# Patient Record
Sex: Female | Born: 1940
Health system: Southern US, Community
[De-identification: ages and names within clinical notes are randomized; demographics above are authoritative.]

## PROBLEM LIST (undated history)

## (undated) DIAGNOSIS — Z95810 Presence of automatic (implantable) cardiac defibrillator: Secondary | ICD-10-CM

## (undated) DIAGNOSIS — I509 Heart failure, unspecified: Secondary | ICD-10-CM

## (undated) DIAGNOSIS — F32A Depression, unspecified: Secondary | ICD-10-CM

## (undated) DIAGNOSIS — E079 Disorder of thyroid, unspecified: Secondary | ICD-10-CM

## (undated) DIAGNOSIS — F329 Major depressive disorder, single episode, unspecified: Secondary | ICD-10-CM

## (undated) DIAGNOSIS — M1990 Unspecified osteoarthritis, unspecified site: Secondary | ICD-10-CM

## (undated) DIAGNOSIS — M797 Fibromyalgia: Secondary | ICD-10-CM

## (undated) DIAGNOSIS — K829 Disease of gallbladder, unspecified: Secondary | ICD-10-CM

## (undated) HISTORY — PX: COLONOSCOPY: SHX174

## (undated) HISTORY — DX: Depression, unspecified: F32.A

## (undated) HISTORY — DX: Disorder of thyroid, unspecified: E07.9

## (undated) HISTORY — DX: Fibromyalgia: M79.7

## (undated) HISTORY — PX: PACEMAKER INSERTION: SHX728

## (undated) HISTORY — DX: Heart failure, unspecified: I50.9

## (undated) HISTORY — DX: Disease of gallbladder, unspecified: K82.9

## (undated) HISTORY — PX: POLYPECTOMY: SHX149

## (undated) HISTORY — DX: Presence of automatic (implantable) cardiac defibrillator: Z95.810

## (undated) HISTORY — DX: Major depressive disorder, single episode, unspecified: F32.9

## (undated) HISTORY — DX: Unspecified osteoarthritis, unspecified site: M19.90

---

## 1999-12-02 ENCOUNTER — Other Ambulatory Visit: Admission: RE | Admit: 1999-12-02 | Discharge: 1999-12-02 | Payer: Self-pay | Admitting: *Deleted

## 2000-12-12 ENCOUNTER — Other Ambulatory Visit: Admission: RE | Admit: 2000-12-12 | Discharge: 2000-12-12 | Payer: Self-pay | Admitting: Internal Medicine

## 2003-10-25 DIAGNOSIS — Z95810 Presence of automatic (implantable) cardiac defibrillator: Secondary | ICD-10-CM

## 2003-10-25 HISTORY — PX: CARDIAC CATHETERIZATION: SHX172

## 2003-10-25 HISTORY — DX: Presence of automatic (implantable) cardiac defibrillator: Z95.810

## 2003-12-16 ENCOUNTER — Inpatient Hospital Stay (HOSPITAL_COMMUNITY): Admission: EM | Admit: 2003-12-16 | Discharge: 2003-12-24 | Payer: Self-pay | Admitting: Emergency Medicine

## 2003-12-23 HISTORY — PX: CARDIAC DEFIBRILLATOR PLACEMENT: SHX171

## 2004-12-08 ENCOUNTER — Ambulatory Visit: Payer: Self-pay | Admitting: Internal Medicine

## 2005-01-05 ENCOUNTER — Other Ambulatory Visit: Admission: RE | Admit: 2005-01-05 | Discharge: 2005-01-05 | Payer: Self-pay | Admitting: Internal Medicine

## 2005-05-19 ENCOUNTER — Ambulatory Visit: Payer: Self-pay

## 2005-08-16 ENCOUNTER — Ambulatory Visit: Payer: Self-pay | Admitting: Internal Medicine

## 2005-12-20 ENCOUNTER — Ambulatory Visit: Payer: Self-pay | Admitting: Internal Medicine

## 2006-03-21 ENCOUNTER — Ambulatory Visit: Payer: Self-pay | Admitting: Internal Medicine

## 2006-06-20 ENCOUNTER — Ambulatory Visit: Payer: Self-pay | Admitting: Internal Medicine

## 2006-10-23 ENCOUNTER — Ambulatory Visit: Payer: Self-pay

## 2006-12-19 ENCOUNTER — Ambulatory Visit: Payer: Self-pay | Admitting: Internal Medicine

## 2007-03-21 ENCOUNTER — Ambulatory Visit: Payer: Self-pay | Admitting: Internal Medicine

## 2007-06-19 ENCOUNTER — Ambulatory Visit: Payer: Self-pay | Admitting: Internal Medicine

## 2007-06-22 ENCOUNTER — Other Ambulatory Visit: Admission: RE | Admit: 2007-06-22 | Discharge: 2007-06-22 | Payer: Self-pay | Admitting: Internal Medicine

## 2007-08-21 ENCOUNTER — Encounter: Admission: RE | Admit: 2007-08-21 | Discharge: 2007-08-21 | Payer: Self-pay | Admitting: Internal Medicine

## 2007-09-25 ENCOUNTER — Ambulatory Visit: Payer: Self-pay | Admitting: Internal Medicine

## 2007-12-25 ENCOUNTER — Ambulatory Visit: Payer: Self-pay | Admitting: Internal Medicine

## 2008-02-28 ENCOUNTER — Encounter: Admission: RE | Admit: 2008-02-28 | Discharge: 2008-02-28 | Payer: Self-pay | Admitting: Internal Medicine

## 2008-03-10 ENCOUNTER — Encounter: Admission: RE | Admit: 2008-03-10 | Discharge: 2008-03-10 | Payer: Self-pay | Admitting: Internal Medicine

## 2008-03-25 ENCOUNTER — Ambulatory Visit: Payer: Self-pay | Admitting: Internal Medicine

## 2008-06-24 ENCOUNTER — Ambulatory Visit: Payer: Self-pay | Admitting: Internal Medicine

## 2008-09-30 ENCOUNTER — Encounter: Admission: RE | Admit: 2008-09-30 | Discharge: 2008-09-30 | Payer: Self-pay | Admitting: Internal Medicine

## 2008-10-07 ENCOUNTER — Ambulatory Visit: Payer: Self-pay | Admitting: Internal Medicine

## 2008-10-24 HISTORY — PX: COLONOSCOPY: SHX174

## 2008-11-07 ENCOUNTER — Ambulatory Visit (HOSPITAL_COMMUNITY): Admission: RE | Admit: 2008-11-07 | Discharge: 2008-11-07 | Payer: Self-pay | Admitting: *Deleted

## 2008-11-07 ENCOUNTER — Encounter (INDEPENDENT_AMBULATORY_CARE_PROVIDER_SITE_OTHER): Payer: Self-pay | Admitting: *Deleted

## 2008-12-02 ENCOUNTER — Encounter (INDEPENDENT_AMBULATORY_CARE_PROVIDER_SITE_OTHER): Payer: Self-pay | Admitting: Diagnostic Radiology

## 2008-12-02 ENCOUNTER — Ambulatory Visit (HOSPITAL_COMMUNITY): Admission: RE | Admit: 2008-12-02 | Discharge: 2008-12-02 | Payer: Self-pay | Admitting: Endocrinology

## 2008-12-17 ENCOUNTER — Encounter: Payer: Self-pay | Admitting: Internal Medicine

## 2009-01-06 ENCOUNTER — Ambulatory Visit: Payer: Self-pay | Admitting: Internal Medicine

## 2009-04-07 ENCOUNTER — Ambulatory Visit: Payer: Self-pay | Admitting: Internal Medicine

## 2009-05-18 ENCOUNTER — Telehealth: Payer: Self-pay | Admitting: Internal Medicine

## 2009-05-19 ENCOUNTER — Ambulatory Visit: Payer: Self-pay

## 2009-07-17 ENCOUNTER — Encounter: Payer: Self-pay | Admitting: Internal Medicine

## 2009-07-22 ENCOUNTER — Ambulatory Visit: Payer: Self-pay | Admitting: Internal Medicine

## 2009-07-28 ENCOUNTER — Encounter: Admission: RE | Admit: 2009-07-28 | Discharge: 2009-07-28 | Payer: Self-pay | Admitting: Endocrinology

## 2009-08-13 ENCOUNTER — Encounter: Payer: Self-pay | Admitting: Internal Medicine

## 2009-11-02 DIAGNOSIS — IMO0001 Reserved for inherently not codable concepts without codable children: Secondary | ICD-10-CM | POA: Insufficient documentation

## 2009-12-15 ENCOUNTER — Ambulatory Visit: Payer: Self-pay | Admitting: Internal Medicine

## 2009-12-15 DIAGNOSIS — I4901 Ventricular fibrillation: Secondary | ICD-10-CM

## 2009-12-15 DIAGNOSIS — Z95810 Presence of automatic (implantable) cardiac defibrillator: Secondary | ICD-10-CM

## 2010-03-16 ENCOUNTER — Ambulatory Visit: Payer: Self-pay | Admitting: Internal Medicine

## 2010-03-16 ENCOUNTER — Encounter: Payer: Self-pay | Admitting: Internal Medicine

## 2010-03-25 ENCOUNTER — Encounter: Payer: Self-pay | Admitting: Internal Medicine

## 2010-06-17 ENCOUNTER — Ambulatory Visit: Payer: Self-pay | Admitting: Internal Medicine

## 2010-07-07 ENCOUNTER — Encounter: Payer: Self-pay | Admitting: Internal Medicine

## 2010-07-26 ENCOUNTER — Encounter: Admission: RE | Admit: 2010-07-26 | Discharge: 2010-07-26 | Payer: Self-pay | Admitting: Endocrinology

## 2010-09-23 ENCOUNTER — Encounter: Payer: Self-pay | Admitting: Internal Medicine

## 2010-09-23 ENCOUNTER — Ambulatory Visit: Payer: Self-pay | Admitting: Internal Medicine

## 2010-10-01 ENCOUNTER — Other Ambulatory Visit
Admission: RE | Admit: 2010-10-01 | Discharge: 2010-10-01 | Payer: Self-pay | Source: Home / Self Care | Admitting: Internal Medicine

## 2010-10-07 ENCOUNTER — Encounter (INDEPENDENT_AMBULATORY_CARE_PROVIDER_SITE_OTHER): Payer: Self-pay | Admitting: *Deleted

## 2010-10-08 ENCOUNTER — Encounter
Admission: RE | Admit: 2010-10-08 | Discharge: 2010-10-08 | Payer: Self-pay | Source: Home / Self Care | Attending: Internal Medicine | Admitting: Internal Medicine

## 2010-11-13 ENCOUNTER — Other Ambulatory Visit: Payer: Self-pay | Admitting: Endocrinology

## 2010-11-13 DIAGNOSIS — E041 Nontoxic single thyroid nodule: Secondary | ICD-10-CM

## 2010-11-14 ENCOUNTER — Encounter: Payer: Self-pay | Admitting: Internal Medicine

## 2010-11-23 NOTE — Cardiovascular Report (Signed)
Summary: Office Visit Remote   Office Visit Remote   Imported By: Roderic Ovens 07/13/2010 11:53:19  _____________________________________________________________________  External Attachment:    Type:   Image     Comment:   External Document

## 2010-11-23 NOTE — Cardiovascular Report (Signed)
Summary: Office Visit Remote   Office Visit Remote   Imported By: Roderic Ovens 03/26/2010 10:53:39  _____________________________________________________________________  External Attachment:    Type:   Image     Comment:   External Document

## 2010-11-23 NOTE — Letter (Signed)
Summary: Remote Device Check  [employer], Main Office  [address]   [place], [place] [address]   Phone: [phone]  Fax: [phone]     July 07, 2010 MRN: [id number]   [facility] [address] [place], [place]  [address]   Dear [name],   Your remote transmission was recieved and reviewed by your physician.  All diagnostics were within normal limits for you.  __X___Your next transmission is scheduled for:  09-23-10.  Please transmit at any time this day.  If you have a wireless device your transmission will be sent automatically.   Sincerely,  Vella Kohler

## 2010-11-23 NOTE — Letter (Signed)
Summary: Remote Device Check  [employer], Main Office  [address]   [place], [place] [address]   Phone: [phone]  Fax: [phone]     March 25, 2010 MRN: [id number]   [facility] [address] [place], [place]  [address]   Dear [name],   Your remote transmission was recieved and reviewed by your physician.  All diagnostics were within normal limits for you.  __X___Your next transmission is scheduled for:   06-17-2010.  Please transmit at any time this day.  If you have a wireless device your transmission will be sent automatically.   Sincerely,  Vella Kohler

## 2010-11-23 NOTE — Assessment & Plan Note (Signed)
Summary: PC2   Visit Type:  Follow-up Primary Provider:  Renne Crigler, MD   History of Present Illness: [name] returns today for followup.  She is a pleasant 70 yo woman with an unexplained VF arrest.  She denies any intercurrent ICD therapies.  She has minimal peripheral edema which has been thought in the past to be due to NSAID use.  She has severe arthritis.  She denies c/p or sob.  Current Medications (verified): 1)  Sertraline Hcl 50 Mg Tabs (Sertraline Hcl) .... Take One Tablet By Mouth Once Daily. 2)  Meloxicam 15 Mg Tabs (Meloxicam) .... As Needed 3)  Synthroid 75 Mcg Tabs (Levothyroxine Sodium) .... Take One Tablet By Mouth Once Daily.  Allergies (verified): 1)  ! Sulfa  Past History:  Past Surgical History: Last updated: 11/02/2009 Defib implantation  Past Medical History: Current Problems:  IMPLANTATION OF DEFIBRILLATOR, HX OF (ICD-V45.02) FIBROMYALGIA (ICD-729.1)    Review of Systems  The patient denies chest pain, syncope, dyspnea on exertion, and peripheral edema.    Vital Signs:  Patient profile:   70 year old female Height:      63 inches Weight:      147 pounds BMI:     26.13 Pulse rate:   74 / minute BP sitting:   122 / 62  (left arm)  Vitals Entered By: Laurance Flatten CMA (December 15, 2009 2:06 PM)  Physical Exam  General:  Well developed, well nourished, in no acute distress.  HEENT: normal Neck: supple. No JVD. Carotids 2+ bilaterally no bruits Cor: RRR no rubs, gallops or murmur Lungs: CTA. Well healed ICD incision. Ab: soft, nontender. nondistended. No HSM. Good bowel sounds Ext: warm. no cyanosis, clubbing or edema Neuro: alert and oriented. Grossly nonfocal. affect pleasant     ICD Specifications Following MD:  Lewayne Bunting, MD     ICD Vendor:  Medtronic     ICD Model Number:  7232     ICD Serial Number:  [id number] H ICD DOI:  12/23/2003     ICD Implanting MD:  Lewayne Bunting, MD  Lead 1:    Location: RV     DOI: 12/23/2003     Model  #: 1914     Serial #: [id number]     Status: active  Indications::  VF ARREST   ICD Follow Up Remote Check?  No Battery Voltage:  3.02 V     Charge Time:  7.92 seconds     ICD Dependent:  No       ICD Device Measurements Right Ventricle:  Amplitude: 15.5 mV, Impedance: 480 ohms, Threshold: 1.0 V at 0.3 msec Shock Impedance: 53 ohms   Episodes Shock:  0     ATP:  0     Nonsustained:  166     Ventricular Pacing:  <0.1%  Brady Parameters Mode VVI     Lower Rate Limit:  40      Tachy Zones VF:  188     VT1:  150 (MONITOR)     Next Remote Date:  03/16/2010     Next Cardiology Appt Due:  11/24/2010 Tech Comments:  No parameter changes.  6949 lead stable, SIC  0.  Updated letter and magnet given to the patient.  Checked by [employer].  [facility]link transmissions every 3 months ROV 1 year Dr. Ladona Ridgel. Cynthia Harm, [name]  December 15, 2009 2:20 PM  MD Comments:  Agree with above.  Impression & Recommendations:  Problem # 1:  IMPLANTATION OF DEFIBRILLATOR, HX  OF (ICD-V45.02) Her device is working normally.  Will recheck in several months.   Problem # 2:  FIBROMYALGIA (ICD-729.1) Her pain is fairly well controlled on Meloxicam.  Problem # 3:  VENTRICULAR FIBRILLATION (ICD-427.41) No recurrent episodes.  Patient Instructions: 1)  Your physician recommends that you schedule a follow-up appointment in: 12 months with Dr Ladona Ridgel

## 2010-11-25 NOTE — Cardiovascular Report (Signed)
Summary: Office Visit Remote   Office Visit Remote   Imported By: Roderic Ovens 10/08/2010 09:32:05  _____________________________________________________________________  External Attachment:    Type:   Image     Comment:   External Document

## 2010-11-25 NOTE — Letter (Signed)
Summary: Remote Device Check  [employer], Main Office  [address]   [place], [place] [address]   Phone: [phone]  Fax: [phone]     October 07, 2010 MRN: [id number]   [facility] [address] [place], [place]  [address]   Dear Ms. Killian,   Your remote transmission was recieved and reviewed by your physician.  All diagnostics were within normal limits for you.  _____Your next transmission is scheduled for:                       .  Please transmit at any time this day.  If you have a wireless device your transmission will be sent automatically.  __X____Your next office visit is scheduled for:   February 2012 with Dr. Ladona Ridgel.                               Sincerely,  Altha Harm, LPN

## 2011-01-04 ENCOUNTER — Encounter (INDEPENDENT_AMBULATORY_CARE_PROVIDER_SITE_OTHER): Payer: Medicare Other | Admitting: Internal Medicine

## 2011-01-04 ENCOUNTER — Encounter: Payer: Self-pay | Admitting: Internal Medicine

## 2011-01-04 DIAGNOSIS — I469 Cardiac arrest, cause unspecified: Secondary | ICD-10-CM

## 2011-01-11 NOTE — Cardiovascular Report (Signed)
Summary: Office Visit   Office Visit   Imported By: Roderic Ovens 01/05/2011 11:33:52  _____________________________________________________________________  External Attachment:    Type:   Image     Comment:   External Document

## 2011-01-11 NOTE — Assessment & Plan Note (Signed)
Summary: device/saf /cy   Primary Provider:  Renne Crigler, MD   History of Present Illness: [name] returns today for followup.  She is a pleasant 70 yo woman with an unexplained VF arrest.  She denies any intercurrent ICD therapies.   Her arthritis is improved.  She denies c/p or sob. No peripheral edema.  Current Medications (verified): 1)  Sertraline Hcl 50 Mg Tabs (Sertraline Hcl) .... Take One Tablet By Mouth Once Daily. 2)  Meloxicam 15 Mg Tabs (Meloxicam) .... As Needed 3)  Synthroid 88 Mcg Tabs (Levothyroxine Sodium) .... Once Daily 4)  Multivitamins   Tabs (Multiple Vitamin) .... Once Daily 5)  Fosamax 70 Mg Tabs (Alendronate Sodium) .... Weekly 6)  Calcium .... Daily 7)  Vitamin D3 .... Daily 8)  Fish Oil   Oil (Fish Oil) .... Daily  Allergies: 1)  ! Sulfa  Past History:  Past Medical History: Last updated: 12/15/2009 Current Problems:  IMPLANTATION OF DEFIBRILLATOR, HX OF (ICD-V45.02) FIBROMYALGIA (ICD-729.1)    Past Surgical History: Last updated: 11/02/2009 Defib implantation  Review of Systems  The patient denies chest pain, syncope, dyspnea on exertion, and peripheral edema.    Vital Signs:  Patient profile:   70 year old female Height:      63 inches Weight:      147 pounds BMI:     26.13 Pulse rate:   74 / minute BP sitting:   110 / 67  (left arm)  Vitals Entered By: [name] CMA (January 04, 2011 12:13 PM)  Nutrition Counseling: Patient's BMI is greater than 25 and therefore counseled on weight management options.  Physical Exam  General:  Well developed, well nourished, in no acute distress.  HEENT: normal Neck: supple. No JVD. Carotids 2+ bilaterally no bruits Cor: RRR no rubs, gallops or murmur Lungs: CTA. Well healed ICD incision. Ab: soft, nontender. nondistended. No HSM. Good bowel sounds Ext: warm. no cyanosis, clubbing or edema Neuro: alert and oriented. Grossly nonfocal. affect pleasant     ICD Specifications Following MD:   Lewayne Bunting, MD     ICD Vendor:  Medtronic     ICD Model Number:  7232     ICD Serial Number:  [id number] H ICD DOI:  12/23/2003     ICD Implanting MD:  Lewayne Bunting, MD  Lead 1:    Location: RV     DOI: 12/23/2003     Model #: 4098     Serial #: [id number]     Status: active  Indications::  VF ARREST   ICD Follow Up ICD Dependent:  No      Brady Parameters Mode VVI     Lower Rate Limit:  40      Tachy Zones VF:  188     VT1:  150 (MONITOR)     MD Comments:  Normal device function.  Impression & Recommendations:  Problem # 1:  IMPLANTATION OF DEFIBRILLATOR, HX OF (ICD-V45.02) Her device is working normally. Will recheck in several months.  Problem # 2:  FIBROMYALGIA (ICD-729.1) Her symptoms are improved. Will follow.  Problem # 3:  VENTRICULAR FIBRILLATION (ICD-427.41) No recurrent syncope and no additional ICD shocks.  Patient Instructions: 1)  Your physician wants you to follow-up in: 12 months with Dr Court Joy will receive a reminder letter in the mail two months in advance. If you don't receive a letter, please call our office to schedule the follow-up appointment. 2)  Your physician recommends that you continue on your current  medications as directed. Please refer to the Current Medication list given to you today.

## 2011-01-24 ENCOUNTER — Inpatient Hospital Stay: Admission: RE | Admit: 2011-01-24 | Payer: Self-pay | Source: Ambulatory Visit

## 2011-01-26 ENCOUNTER — Other Ambulatory Visit: Payer: Self-pay | Admitting: Endocrinology

## 2011-01-26 DIAGNOSIS — E041 Nontoxic single thyroid nodule: Secondary | ICD-10-CM

## 2011-03-08 NOTE — Assessment & Plan Note (Signed)
[facility]                         ELECTROPHYSIOLOGY OFFICE NOTE   NAME:Meisinger, ARRIN ISHLER                      MRN:          [id number]  DATE:09/25/2007                            DOB:          1941/08/08    Ms. Odenthal returns today for follow up.  She is a very pleasant middle-  aged women with a history of resuscitated VF arrest and minimal anoxic  encephalopathy who is status post ICD insertion.  She returns today for  follow up.  In the interim, she has had no intercurrent ICD therapies.  She denies chest pain, she denies shortness of breath.   PHYSICAL EXAMINATION:  GENERAL:  On exam today, she is a pleasant, well-  appearing woman in no acute distress.  VITAL SIGNS:  Blood pressure was 120/68, pulse 64 and regular,  respirations 18.  Weight was 142 pounds.  NECK:  No jugular venous distention.  LUNGS:  Clear bilaterally to auscultation.  No wheezing, rales, or  rhonchi were present.  CARDIOVASCULAR:  Regular rate and rhythm.  Normal S1 and S2.  EXTREMITIES:  No edema.   Interrogation of her defibrillator demonstrates a Medtronic Maximo with  R waves of 15, impendence 448 and thresholds of 0.2.  Battery voltage  was 3.10 volts.  There were no intercurrent ICD therapies.   IMPRESSION:  1. Status post ventricular fibrillation (VF) arrest.  2. History of kidney stones.  3. History of gallbladder problems status post removal.   DISCUSSION:  Overall, Ms. Lares is stable.  Her defibrillator is  working normally.  There have been no intercurrent ICD therapies.  Will  plan to see her back in the office in 1 year.  She will follow up in our  care link program.     Doylene Canning. Ladona Ridgel, MD  Electronically Signed    GWT/MedQ  DD: 09/25/2007  DT: 09/25/2007  Job #: [id number]   cc:   Georgiana Spinner, M.D.  Soyla Murphy. Renne Crigler, M.D.

## 2011-03-08 NOTE — Op Note (Signed)
NAMEJUDI, [name]               ACCOUNT NO.:  [id number]   MEDICAL RECORD NO.:  [id number]          PATIENT TYPE:  AMB   LOCATION:  ENDO                         FACILITY:  [facility]   PHYSICIAN:  Georgiana Spinner, M.D.    DATE OF BIRTH:  [birth year]   DATE OF PROCEDURE:  11/07/2008  DATE OF DISCHARGE:                               OPERATIVE REPORT   PROCEDURE:  Upper endoscopy.   INDICATIONS:  GERD.   ANESTHESIA:  Fentanyl 60 mcg, Versed 5 mg.   PROCEDURE:  With the patient mildly sedated in the left lateral  decubitus position, the Pentax videoscopic endoscope was inserted in the  mouth and passed under direct vision through the esophagus which  appeared normal into the stomach.  Fundus, body, antrum, duodenal bulb,  and second portion duodenum all appeared normal.  From this point, the  endoscope was slowly withdrawn, taking circumferential views of duodenal  mucosa until the endoscope had been pulled back into stomach and placed  in retroflexion to view the stomach from below.  The endoscope was  straightened and withdrawn, taking circumferential views of the  remaining gastric and esophageal mucosa.  The patient's vital signs and  pulse oximeter remained stable.  The patient tolerated the procedure  well without apparent complication.   FINDINGS:  Unremarkable examination.   PLAN:  Proceed to colonoscopy.           ______________________________  Georgiana Spinner, M.D.     GMO/MEDQ  D:  11/07/2008  T:  11/07/2008  Job:  [id number]

## 2011-03-08 NOTE — Op Note (Signed)
Harrington, Cynthia               ACCOUNT NO.:  [id number]   MEDICAL RECORD NO.:  [id number]          PATIENT TYPE:  AMB   LOCATION:  ENDO                         FACILITY:  [facility]   PHYSICIAN:  Georgiana Spinner, M.D.    DATE OF BIRTH:  06/20/1941   DATE OF PROCEDURE:  11/07/2008  DATE OF DISCHARGE:                               OPERATIVE REPORT   PROCEDURE:  Colonoscopy.   INDICATIONS:  Colon cancer screening.   ANESTHESIA:  Fentanyl 40 mcg, Versed 5 mg.   PROCEDURE:  With the patient mildly sedated in the left lateral  decubitus position, the Pentax videoscopic colonoscope was inserted in  the rectum and passed under direct vision with pressure applied and the  patient rolled to her back.  We were able to advance through a tortuous  sigmoid colon to reach the cecum identified by base of cecum and  ileocecal valve.  From this point, the colonoscope was slowly withdrawn,  taking circumferential views of colonic mucosa, stopping in the  ascending colon where a flat polyp was seen.  It was red and sessile.  It was photographed, and it was removed after placing a magnet over her  ICD in her chest.  We were able to remove this using hot biopsy forceps  technique, setting of 20/150 blended current.  Remaining adenomatous-  type tissue was eradicated using the same hot biopsy forceps.  From this  point, the colonoscope was then further withdrawn, taking  circumferential views of the remaining colonic mucosa, stopping in the  sigmoid colon where it was thickened due to diverticulosis, and a second  polyp was seen that was much larger, probably a centimeter in size, and  it was removed using snare cautery technique that was pedunculated on a  thick wide polyp base.  Tissue was retrieved by suctioning it into the  endoscope and removing it.  Subsequently, we then reinserted the  colonoscope to this level and withdrew the colonoscope, stopping again  just distal to this area where a second  polyp in this area was seen,  photographed and removed again using snare cautery technique with the  same setting.  Tissue was retrieved by suctioning the polyp through the  scope into a tissue trap.  The endoscope was then withdrawn to the  rectum which appeared normal on direct and showed hemorrhoids on  retroflexed view.  The endoscope was straightened and withdrawn.  The  patient's vital signs and pulse oximeter remained stable.  The patient  tolerated the procedure well without apparent complication.   FINDINGS:  Polyp of ascending colon.  Polyps of sigmoid colon.  Internal  hemorrhoids.  Otherwise unremarkable exam.   PLAN:  Await biopsy report.  The patient will call me for results and  follow up as an outpatient.  Of note, once the procedure was terminated,  the magnet was removed from the ICD.           ______________________________  Georgiana Spinner, M.D.     GMO/MEDQ  D:  11/07/2008  T:  11/07/2008  Job:  [id number]

## 2011-03-08 NOTE — Assessment & Plan Note (Signed)
[facility]                         ELECTROPHYSIOLOGY OFFICE NOTE   NAME:Casstevens, SOMAYA GRASSI                      MRN:          [id number]  DATE:10/07/2008                            DOB:          1941/04/22    Ms. [name] returns today for followup.  She is a very pleasant middle-  aged woman with a history of VF arrest status post successful  resuscitation.  She has a history of gallbladder and kidney disease, but  is otherwise been stable.  She denies chest pain.  She denies shortness  of breath.   CURRENT MEDICATIONS:  Multivitamin, vitamin E, Zoloft, and vitamin D.  She also takes vitamin E.   PHYSICAL EXAMINATION:  GENERAL:  She is a pleasant well-appearing woman  in no acute distress.  VITAL SIGNS:  Blood pressure was 118/57, the pulse 60 and regular,  respirations were 18, and the weight was 146 pounds.  NECK:  No jugular venous distention.  LUNGS:  Clear bilaterally to auscultation.  No wheezes, rales, or  rhonchi are present.  There is no short of breathing.  CARDIOVASCULAR:  Regular rate and rhythm.  Normal S1 and S2.  EXTREMITIES:  No edema.   Interrogation of her defibrillator demonstrated a Medtronic Maximo  implanted in March 2005.  The R waves with 13, the impedance 456 with  thresholds of volt at 0.3.  Battery voltage was 3 volts.  Underlying  rhythm was sinus at 60.  She had an 69 and 49 lead implanted with no  episodes.   IMPRESSION:  1. Status post ventricular fibrillation arrest with successful      resuscitation.  2. Status post implantable cardioverter-defibrillator insertion.   DISCUSSION:  Overall, Ms. Boothe is stable.  Her defibrillator is  working normally.  She has had no recurrent ventricular arrhythmias.  We  will see the patient back in followup, and her defibrillator in 1 year.     Doylene Canning. Ladona Ridgel, MD  Electronically Signed    GWT/MedQ  DD: 10/07/2008  DT: 10/08/2008  Job #: [id number]   cc:   Georgiana Spinner,  M.D.  Soyla Murphy. Renne Crigler, M.D.

## 2011-03-11 NOTE — Assessment & Plan Note (Signed)
[facility]                         ELECTROPHYSIOLOGY OFFICE NOTE   NAME:Harrington, Cynthia YOUSE                      MRN:          [id number]  DATE:10/23/2006                            DOB:          [birth year]    DEVICE CLINIC NOTE:  Ms. Miles is seen in device clinic today,  October 23, 2006, for routine followup of her Medtronic Maximo 7232  single-chamber defibrillator implanted in March 2005 following a VF  arrest.  She also has a 6949 lead that was reprogrammed at this time.  Upon interrogation, there were 183 episodes stored in the device.  She  does have a monitoring zone programmed at 150 beats per minute.  Battery  voltage measures 3.13 V, with a charge time of 7.46 seconds.  In the  right ventricle, intrinsic amplitude is 14.6 mV, with an impedance of  480 ohms and threshold of 1 V at 0.2 msec.  High-voltage lead impedance  is 48 ohms.  No programming changes other than recommended lead changes  were made.  She will do [facility] transmissions and 3, 6, and 9 months'  time, and will be seen by Dr. Ladona Ridgel in one year's time.      Cleatrice Burke, RN  Electronically Signed      Doylene Canning. Ladona Ridgel, MD  Electronically Signed   CF/MedQ  DD: 10/23/2006  DT: 10/23/2006  Job #: [id number]

## 2011-03-11 NOTE — Cardiovascular Report (Signed)
NAME:  [name], [name]                         ACCOUNT NO.:  [id number]   MEDICAL RECORD NO.:  [id number]                   PATIENT TYPE:  INP   LOCATION:  4743                                 FACILITY:  [facility]   PHYSICIAN:  Charlies Constable, M.D.                  DATE OF BIRTH:  [birth year]   DATE OF PROCEDURE:  12/22/2003  DATE OF DISCHARGE:                              CARDIAC CATHETERIZATION   PROCEDURE:  Cardiac catheterization.   CARDIOLOGIST:  Charlies Constable, M.D.   HISTORY OF PRESENT ILLNESS:  [name] is 70 years old and suffered an out  of hospital cardiac arrest.  When EMS arrived, she was found to be asystolic  and subsequently went into ventricular fibrillation and required multiple  shocks.  It was thought that she would not have neurological recovery but  she has made a fairly dramatic neurological recovery.  Her enzymes were  negative for a myocardial infarction and echocardiogram showed good wall  motion.  Her EKG showed diffuse T-wave changes with a QT corrected to 500  msec.  She is scheduled for evaluation and angiography today.   DESCRIPTION OF PROCEDURE:  The procedure was performed via the right femoral  artery using an arterial sheath and 6 French preformed coronary catheters.  A front wall arterial puncture was performed, and Omnipaque contrast was  used.  The patient tolerated the procedure and left the laboratory in  satisfactory condition.   RESULTS:  PRESSURES:  1. The aortic pressure is 157/76 with a mean of 109.  2. The left ventricular pressure is 157/17.   CORONARY ANGIOGRAPHY:  The left main coronary artery:  The left main coronary artery was free of  significant disease.   Left anterior descending:  The left anterior descending artery gave rise to  a septal perforator and two diagonal branches.  The LAD terminated before  the apex.  The taper was quite rapid, but I think this was a natural taper  rather than a pathologic occlusion.   Circumflex  artery:  The circumflex artery gave rise to an intermediate  branch and two posterior lateral branch.  This vessel was free of  significant disease.   Right coronary artery:  The right coronary artery is a large dominant vessel  that gave rise to a right ventricular branch, a posterior descending branch,  and a two posterior lateral branches.  The posterior descending branch  reached all the way to the apex.  There was significant obstruction in this  vessel.   LEFT VENTRICULOGRAPHY:  The left ventriculogram was performed in the RAO  projection and showed good wall motion with no areas of hypokinesis.  The  estimated ejection fraction of 60%.   The left ventriculogram performed in the LAO projection showed good wall  motion with no evidence of hypokinesis.   CONCLUSION:  Normal coronary angiography and left ventricular wall motion.   RECOMMENDATIONS:  The  patient has suffered an out of hospital cardiac arrest  with asystole and then ventricular fibrillation.  The electrocardiogram  shows diffuse T-wave changes and a prolonged QT interval.  I discussed this  situation with Dr. Andee Lineman, and we plan to get a spiral CT to rule out  pulmonary embolism and will plan to get an EP consult for consideration for  defibrillator.                                               Charlies Constable, M.D.    BB/MEDQ  D:  12/22/2003  T:  12/23/2003  Job:  [id number]   cc:   Casimiro Needle B. Sherene Sires, M.D. [facility]   Learta Codding, M.D.   Catherine A. Orlin Hilding, M.D.  [address]  [address]  [place]  [place] [address]  Fax: [phone]   Cardiopulmonary Lab

## 2011-03-11 NOTE — Discharge Summary (Signed)
NAME:  [name], [name]                         ACCOUNT NO.:  [id number]   MEDICAL RECORD NO.:  [id number]                   PATIENT TYPE:  INP   LOCATION:  4743                                 FACILITY:  [facility]   PHYSICIAN:  Oley Balm. Sung Amabile, M.D. [facility]          DATE OF BIRTH:  [birth year]   DATE OF ADMISSION:  12/16/2003  DATE OF DISCHARGE:  12/24/2003                                 DISCHARGE SUMMARY   DISCHARGE DIAGNOSES:  1. Independent respiratory failure secondary to sudden cardiac arrest of     atrial fibrillation.  2. Hypoxia induced encephalopathy.  3. Acute renal failure.   HISTORY OF PRESENT ILLNESS:  [name] is a 70 year old white female with  history of tobacco abuse but no prior history of coronary artery disease.  She was admitted December 16, 2003, status post cardiac arrest.  She was  urgently intubated in the emergency room after being bag mask valve  controlled in the field.  The etiology of her distress was unknown.  Downtime was disputed and could have been up to 15 minutes.  She  subsequently converted to a ventricular fibrillation and course of  resuscitation and was shocked 9 times.  She remained on a mechanical  ventilatory support.  Pulmonary critical care was asked to evaluate and  assume her care due to the fact that she was vent dependent.   LABORATORY DATA:  Sodium 139, potassium 3.3, chloride 106, CO2 28, glucose  104, BUN 3, creatinine 0.7, calcium 8.9, magnesium 1.9, heparin level 0.39.  WBC 7.9, hemoglobin 14.3, hematocrit 42.4, platelets are 313.  Arterial  blood gas on 100% FiO2 on pressure __________ total tidal volume 750, rate  of 12, 5 of PEEP.  The  pH 7.45, PCO2 34 with O2 502 with bicarbonate 24.  AST was 116, ALT 108, ALP 60, total bilirubin 0.7, CK reached a peak of 718,  CK-MB reached a peak of 8.6, troponin I reached a peak of 0.9 of 1.05, TSH  was 0.360.  Respiratory culture showed normal __________ flora.   RADIOGRAPHIC DATA:  CT  of the chest shows negative for pulmonary embolism,  left pleural effusion with basilar atelectasis.  Chest x-ray shows pulmonary  vascular prominence, possible small pleural effusion to the left.  A 12-lead  EKG demonstrates ST wave abnormality with inferior ischemia with  anterolateral ischemia.  Ventricular rate of 91 beats per minutes.  CR  interval of 154 milliseconds.  QS duration of 84 with a QT/QTC of 352, 432  with a PRRT axis of 69, 62, and 182.  A 2D echo demonstrated on 12/18/2003  overall left ventricular systolic function normal, EF was 55-65%.  Cardiac  catheterization on February 28; that LV was normal.  Decision was made to  require an EP study.  Note that there was procedure note on December 23, 2003,  where the procedure was a VVI ICD for status post ventricular defibrillation  arrest.  Amplitude was 19.8.  Impedance was 990.  Threshold was 0.6 V, 0.5  PW, DFTS less than or equal to 65, __________ V pacing and sensitivity.  This was performed by Dr. Sharrell Ku.   HOSPITAL COURSE:  #1 -  INDEPENDENT RESPIRATORY FAILURE.  The patient was  admitted to [facility] and was orally and tracheally intubated from  February 22, and has since been liberated from mechanical ventilatory  support by December 18, 2003.  She had no further pulmonary complications at  that time.   #2 - HYPOXIC INDUCED ENCEPHALOPATHY.  She had an evaluation by Dr. Marcelino Freestone as noted in the chart.  She will be evaluated on outpatient basis by  Dr. Arley Phenix for further treatment of her anoxia brain injury.   #3 - STATUS POST CARDIAC ARREST WITH IMPLANTATION OF VVI PACER WITH INTERNAL  CARDIAC DEFIBRILLATOR ON December 22, 2003 BY DR. Sharrell Ku.  This was  noted on his extensive notes.  Please see note for further information.   FOLLOWUP:  She will be followed up by the Cardiology Service after  discharge.   DISCHARGE MEDICATIONS:  1. Aspirin 81 mg daily.  2. Toprol XL 25 mg  once daily.   DIET:  Low-salt, heart healthy.   SPECIAL INSTRUCTIONS:  She is to call Hershal Coria, Psy.D. who works  with brain trauma patients at [phone].  She is also to follow up with  Cardiology, Dr. Sharrell Ku.  Follow up with Dr. Merri Brunette who is her  primary care physician.  There will be no need to see the pulmonary  specialists.   CONDITION ON DISCHARGE:  Improved.  She has been liberated from mechanical  ventilatory support.      Brett Canales Minor, A.C.N.P. [facility]                 Oley Balm. Sung Amabile, M.D. [facility]    SM/MEDQ  D:  12/24/2003  T:  12/24/2003  Job:  [id number]   cc:   Georgiana Spinner, M.D.  [address]  [place]  [place] [address]  Fax: [phone]   Doylene Canning. Ladona Ridgel, M.D.   Soyla Murphy. Renne Crigler, M.D.  [address]  [place]  [place] [address]  Fax: [phone]   Gustavus Messing. Orlin Hilding, M.D.  [address]  [address]  [place]  [place] [address]  Fax: [phone]

## 2011-03-11 NOTE — Consult Note (Signed)
NAME:  [name], [name]                         ACCOUNT NO.:  [id number]   MEDICAL RECORD NO.:  [id number]                   PATIENT TYPE:  INP   LOCATION:  2112                                 FACILITY:  [facility]   PHYSICIAN:  Santina Evans A. Orlin Hilding, M.D.          DATE OF BIRTH:  17-Feb-1941   DATE OF CONSULTATION:  12/16/2003  DATE OF DISCHARGE:                                   CONSULTATION   REFERRING PHYSICIAN:  Dr. Nyoka Cowden.   REASON FOR CONSULTATION:  Post-cardiac arrest.   HISTORY OF PRESENT ILLNESS:  [name] is a 70 year old woman with a  history of fibromyalgia and gallbladder disease, who was found unresponsive  by her husband earlier this morning with gurgling respirations.  Duration of  her distress is unknown.  Husband attempted CPR.  She was in asystole when  EMS arrived.  She converted to ventricular fibrillation and eventually to a  supraventricular tachycardia.  She was intubated.  During the course of her  resuscitation, she was shocked 9 times, received epinephrine, atropine,  lidocaine and has had decerebrate and/or decorticate posturing since  arrival.   REVIEW OF SYSTEMS:  Review of systems is unobtainable.   PAST MEDICAL HISTORY:  Past medical history is significant for at least  fibromyalgia and gallbladder disease   MEDICATIONS:  Medications are unknown.   ALLERGIES:  Allergies are unknown.   SOCIAL HISTORY:  She lives with her husband; further history is not known.   FAMILY HISTORY:  Unable to obtain.  From a nephew, she was not bed-bound,  was at least ambulatory.   OBJECTIVE:  VITAL SIGNS:  Temperature 96.8, blood pressure 137/67, heart  rate in the 113 range, respirations are in the 17-19 range, ventilator is  set at 12.  She is 100% saturated.  HEENT:  The head is normocephalic, atraumatic.  She has some diffuse  rigidity throughout.  NEUROLOGIC:  She is not responsive to voice or gentle physical stimulus.  She does posture to deep  sternal rub.  She has an alternating  decerebrate/decorticate-type of pattern of upper extremity posturing, for  the most part, however, has increased tone and a sort of cortical fist.  There is spontaneous movement on the right upper extremity, but it is also  abnormal posturing.  She has extensor posturing of the legs with forced  plantarflexed feet and increased tone diffusely.  Cranial nerves:  Pupils  are rather large, probably due to the atropine, but do react briskly fairly  equally.  There is some degree of hippus present.  Extraocular movements:  There is some spontaneous roving eye movement a few degrees past midline to  either side; they can be forced with oculocephalic maneuver further to  either side.  The cranial response is fairly reliably reproducible and brisk  on the left, both to the sternutatory and corneal; on the right, it is a  little more equivocal.  She has a  spontaneous swallow.  There is no obvious  facial asymmetry, although she is intubated and it is difficult to tell.  She is breathing over the set ventilator rate. On motor exam, as already  noted, she has increased extensor hypertonicity diffusely with alternate  decorticate and decerebrate posturing in the upper extremities with some  spontaneous posturing in the right upper extremity, whether stimulus is  present or not.  Reflexes are brisk diffusely with upgoing toes.  Coordination not possible to assess.  Sensory:  She does grimace and  withdraw to pain in all four extremities, although some if it is abnormal  posturing to pain.   LABORATORY AND ACCESSORY CLINICAL DATA:  CT scan is fairly unremarkable.  There is no blurring of the deep gray-white junction.  Sulci may be effaced  posteriorly compared to anteriorly but I really cannot tell if that is  pathologically it.   The most significant laboratory finding is elevated cardiac enzymes  suggesting possible MI.   IMPRESSION:  Anoxic/hypoxic brain  injury, the extent of which is difficult  to tell this early.  She has all the cardinal brain stem reflexes present,  but she also has a lot of abnormal motor posturing as well.   RECOMMENDATIONS:  Watch and wait.  The next few days will probably tell the  tale.  We may need to eventually do EEG and CT, depending on her clinical  course over the next few days.  Overall, the prognosis for a meaningful  recovery after such an event is poor, 25%, and about half of those will have  some degree of severe disability.  Will follow.                                               Catherine A. Orlin Hilding, M.D.    CAW/MEDQ  D:  12/16/2003  T:  12/16/2003  Job:  [id number]

## 2011-03-11 NOTE — Op Note (Signed)
NAME:  [name], [name]                         ACCOUNT NO.:  [id number]   MEDICAL RECORD NO.:  [id number]                   PATIENT TYPE:  INP   LOCATION:  4743                                 FACILITY:  [facility]   PHYSICIAN:  Doylene Canning. Ladona Ridgel, M.D.               DATE OF BIRTH:  [birth year]   DATE OF PROCEDURE:  12/23/2003  DATE OF DISCHARGE:                                 OPERATIVE REPORT   PROCEDURE PERFORMED:  Insertion of a single-chamber defibrillator.   INDICATIONS:  Unexplained VF arrest.   I: INTRODUCTION:  The patient is a 70 year old woman who was found to have a  spontaneous episode of sudden cardiac death was successfully resuscitated.  She underwent catheterization demonstrating no coronary disease with normal  LV function.  She initially had hypoxic encephalopathy, but this has  resolved.  She is now referred for ICD insertion.   II: PROCEDURE:  After informed consent was obtained, the patient was taken  to the Diagnostic EP Lab in the fasting state.  After the usual preparation  and draping, intravenous Fentanyl and Midazolam were given for sedation.  A  total of 30 cc of lidocaine was infiltrated into the left infraclavicular  region.  A 9 cm incision was carried over this region and electrocautery  utilized to dissect down to the fascial plane.  Then 10 cc of contrast  injected into the left upper extremity venous system and demonstrated a  patent left subclavian vein.  It was punctured and the Medtronic model 6949  58 cm active fixation defibrillation lead serial number [id number] was  advanced into the right ventricle.  Mapping was carried out in the right  ventricle at the final site.  The R-waves measured 19 mV and the pacing  impedance 990 ohms.  The patient threshold was found to be 0.6 V at 0.5 ms  once the lead was actively fixed.  Ten volt pacing did not stimulate the  diaphragm.  With the lead in satisfactory position, it was secured to the  subpectoralis  fascia with a figure-of-eight silk suture.  The sewing sleeve  was secured with silk suture.  The Medtronic Maximal VR model 7232 single-  chamber defibrillator, serial number [id number] was connected to the  defibrillation lead and placed in the subcutaneous pocket.  The generator  was secured with silk suture.  Kanamycin irrigation was utilized to irrigate  the incision.  Electrocautery was utilized to assure hemostasis.  At this  point the patient was deeply sedated with Fentanyl and Versed and  defibrillation threshold testing carried out.   After the patient was deeply sedated with Fentanyl and Versed, VF was  induced with a T-wave shock.  An initial 15 joule shock was delivered,  terminating ventricular fibrillation and restoring sinus rhythm.  Five  minutes was allowed to elapse and a second VFT test carried out.  At this  time VF was induced with T-wave  shock and a 6 joule shock was delivered,  terminating ventricular fibrillation and restoring sinus rhythm.   At this point no additional defibrillation threshold testing was carried  out.  The generator was secured with a silk suture.  Additional kanamycin  was utilized to irrigate the incision and the incision then closed with a  layer of 2-0 Vicryl followed by a layer of 3-0 Vicryl followed by a layer of  4-0 Vicryl.  Benzoin was painted on the skin.  Steri-Strips were applied and  a pressure dressing placed and the patient returned to her room in  satisfactory condition.   III: COMPLICATIONS:  There were no immediate procedure complications.   IV: RESULTS:  This demonstrates successful implantation of a Medtronic  single-chamber defibrillator in a patient with an unexplained ventricular  fibrillation arrest.                                               Doylene Canning. Ladona Ridgel, M.D.    GWT/MEDQ  D:  12/23/2003  T:  12/24/2003  Job:  [id number]   cc:   Learta Codding, M.D.   Charlaine Dalton. Sherene Sires, M.D. [facility]   Kathrine Cords, R.N. [facility]

## 2011-03-11 NOTE — Consult Note (Signed)
NAME:  [name], [name]                         ACCOUNT NO.:  [id number]   MEDICAL RECORD NO.:  [id number]                   PATIENT TYPE:  INP   LOCATION:  2112                                 FACILITY:  [facility]   PHYSICIAN:  Learta Codding, M.D.                 DATE OF BIRTH:  September 13, 1941   DATE OF CONSULTATION:  12/18/2003  DATE OF DISCHARGE:                                   CONSULTATION   REASON FOR CONSULTATION:  Evaluation of patient status post cardiac arrest  with improved neurologic recovery.   HISTORY OF PRESENT ILLNESS:  Ms. Tissue is a 70 year old female with a  history of tobacco use, but no prior history of coronary artery disease. She  was admitted on December 16, 2003 post cardiac arrest. The patient  reportedly was found unresponsive by her husband earlier that morning with  gurgling respirations. The duration of her distress was unknown. The husband  initially attempted CPR. At the time of arrival of EMS the patient appeared  to be in asystole. The down time was not clear, but could have been up to 15  minutes. She subsequently converted to ventricular fibrillation and in the  course of resuscitation was shocked nine times. Ultimately, she remained in  initial supraventricular rhythm and then sinus tachycardia. She was also  intubated in the field and received Epinephrine, Atropine, and lidocaine. On  arrival to [facility] the patient initially was unresponsive, but  did have brainstem reflexes with decerebrate end corticate posturing upon  arrival. The patient was seen by neurology. In the next ensuing days her  mental function improved. She is now extubated. She is alert and oriented  x1. She still has a retrograde amnesia and is quite confused, but does  respond to simple commands and is otherwise appropriate.   LABORATORY DATA:  Laboratory work on admission did show that the patient had  an elevated cardiac troponin with a peak troponin of 1.05. CK was  elevated  at 2194, CK-MB of 14. Initial electrocardiogram demonstrated sinus  tachycardia with nonspecific T-wave changes. Subsequent electrocardiogram  the next day did show sinus tachycardia now with deeply inverted symmetrical  T-wave changes. However, there was no evidence of ST elevation myocardial  infarction.   PAST MEDICAL HISTORY:  The patient's history was reviewed with her husband  and her sister. The patient reportedly was not on any psychotropic  medications or QT prolonging drugs as far as we know. She did report over  the last several weeks increased symptoms of what appears to be her chronic  fibromyalgia as well as acid reflux. She was in the process of a workup with  [facility] with a gallbladder ultrasound and was also  given antireflux measures. The husband tells me that the patient is rather  secretive about her medical care and he was not aware of this, but he did  find a  receipt for a gallbladder ultrasound. She apparently is rather active  and has no prior history of cardiac disease. She does smoke up to a pack a  day.   ALLERGIES:  No known drug allergies.   MEDICATIONS:  In the hospital the patient received:  1. Enoxaparin 40 mg q.24.  2. Metoprolol 5 mg IV q.6h.  3. Insulin p.r.n.  4. Haldol for confusion.  5. Protonix 40 mg a day.  6. Maxipime 1 g q.12h.  7. Midazolam and Lorazepam for sedation.   SOCIAL HISTORY:  As outlined above. The patient lives with her husband. She  does smoke. There is no history of use.   FAMILY HISTORY:  Reportedly no history of premature coronary artery disease.  Mother was 107 years old and grandmother died when she was 9.   REVIEW OF SYSTEMS:  As per HPI. The patient's history is still difficult.  She reports, however, no chest pain, shortness of breath, nausea or  vomiting, abdominal pain at the present time. There is no prior history of  syncope or presyncope and this history was obtained per the  husband.   PHYSICAL EXAMINATION:  VITAL SIGNS:  Blood pressure 130/75, heart rate 110  beats per minute, temperature afebrile.  GENERAL:  Well-nourished somewhat pale appearing white female in no  distress. Confused, but responding to verbal commands.  NECK:  Normal carotid upstroke. No carotid bruit. No JVD.  LUNGS:  Clear breath sounds with a few crackles at the bases, but no  wheezing.  HEART:  Regular rate and rhythm. Tachycardic. Soft murmur in the left upper  sternal border. PMI is nondisplaced. No S3 or S4.  ABDOMEN:  Soft, nontender. No rebound or guarding. Good bowel sounds.  EXTREMITIES:  Peripheral pulses are palpable. Femoral pulses are 2+  bilaterally with no bruits. Dorsalis pedis and posterior tibial pulses are  intact. There is trace peripheral pitting edema.   LABORATORY DATA:  A 12-lead electrocardiogram as outlined above.   Sodium 137, potassium 8.4, BUN 8, creatinine 0.7. CBC:  Hemoglobin 14.6,  white count 17.3, and platelet count is 286. Troponin as outlined above.   IMPRESSION/PLAN:  Status post cardiac arrest. The differential diagnosis  include ischemic heart disease and the patient ultimately will need a  cardiac catheterization. This will be further discussed below. Included in  the differential diagnosis pulmonary embolism or primary arrhythmia  secondary to ST-QT prolonging medications (none obtained in the history).  Other forms of structural heart disease are also possible including  arrhythmogenic biventricular dysplasia. The patient will require further  workup. Initially would proceed with an echocardiographic study as this will  narrow down the differential diagnosis noninvasive. Electrocardiographic  changes are suggestive of underlying ischemic heart disease, but her mildly  elevated troponin makes me suspicious of a possible pulmonary embolism. In  the interim I would start her on aspirin, IV heparin, and increase her IV beta blocker. I would  also consider a spiral CT scan prior to performing a  cardiac catheterization.   The patient definitely will need a cardiac catheterization in the next  couple of days. I would, however, wait for some improved neurologic recovery  and make sure that we are not dealing with some underlying etiology like  pulmonary embolism.   Orders have been written for the patient to go on aspirin, IV heparin, beta  blocker therapy, as well as 2-D echocardiographic study. We will evaluate  her in the next few days for a cardiac catheterization. I have  discussed the  assessment and plan at great length with the patient's husband and sister  and they are in agreement with our plan.                                               Learta Codding, M.D.    GED/MEDQ  D:  12/18/2003  T:  12/18/2003  Job:  [id number]   cc:   Charlaine Dalton. Sherene Sires, M.D. [facility]   Dr. Kevan Rosebush A. Orlin Hilding, M.D.  [address]  [address]  [place]  [place] [address]  Fax: [phone]   [facility]

## 2011-04-07 ENCOUNTER — Encounter: Payer: Medicare Other | Admitting: *Deleted

## 2011-04-11 ENCOUNTER — Encounter: Payer: Self-pay | Admitting: *Deleted

## 2011-04-14 ENCOUNTER — Ambulatory Visit (INDEPENDENT_AMBULATORY_CARE_PROVIDER_SITE_OTHER): Payer: Medicare Other | Admitting: *Deleted

## 2011-04-14 ENCOUNTER — Other Ambulatory Visit: Payer: Self-pay | Admitting: Internal Medicine

## 2011-04-14 DIAGNOSIS — I4901 Ventricular fibrillation: Secondary | ICD-10-CM

## 2011-04-14 DIAGNOSIS — Z95810 Presence of automatic (implantable) cardiac defibrillator: Secondary | ICD-10-CM

## 2011-04-26 ENCOUNTER — Encounter: Payer: Self-pay | Admitting: *Deleted

## 2011-04-26 NOTE — Progress Notes (Signed)
icd remote check  

## 2011-07-21 ENCOUNTER — Encounter: Payer: Self-pay | Admitting: Internal Medicine

## 2011-07-21 ENCOUNTER — Other Ambulatory Visit: Payer: Self-pay | Admitting: Internal Medicine

## 2011-07-21 ENCOUNTER — Ambulatory Visit (INDEPENDENT_AMBULATORY_CARE_PROVIDER_SITE_OTHER): Payer: Medicare Other | Admitting: *Deleted

## 2011-07-21 DIAGNOSIS — I4901 Ventricular fibrillation: Secondary | ICD-10-CM

## 2011-07-21 DIAGNOSIS — I469 Cardiac arrest, cause unspecified: Secondary | ICD-10-CM

## 2011-07-27 ENCOUNTER — Encounter: Payer: Self-pay | Admitting: *Deleted

## 2011-07-28 NOTE — Progress Notes (Signed)
icd remote  

## 2011-08-01 ENCOUNTER — Ambulatory Visit
Admission: RE | Admit: 2011-08-01 | Discharge: 2011-08-01 | Disposition: A | Discharge status: Home / Self Care | Payer: Medicare Other | Source: Ambulatory Visit | Attending: Endocrinology | Admitting: Endocrinology

## 2011-08-01 DIAGNOSIS — E041 Nontoxic single thyroid nodule: Secondary | ICD-10-CM

## 2011-10-11 ENCOUNTER — Other Ambulatory Visit (HOSPITAL_COMMUNITY)
Admission: RE | Admit: 2011-10-11 | Discharge: 2011-10-11 | Disposition: A | Discharge status: Home / Self Care | Payer: Medicare Other | Source: Ambulatory Visit | Attending: Internal Medicine | Admitting: Internal Medicine

## 2011-10-11 ENCOUNTER — Other Ambulatory Visit: Payer: Self-pay | Admitting: Registered Nurse

## 2011-10-11 DIAGNOSIS — Z01419 Encounter for gynecological examination (general) (routine) without abnormal findings: Secondary | ICD-10-CM | POA: Insufficient documentation

## 2011-10-20 ENCOUNTER — Ambulatory Visit (INDEPENDENT_AMBULATORY_CARE_PROVIDER_SITE_OTHER): Payer: Medicare Other | Admitting: *Deleted

## 2011-10-20 ENCOUNTER — Encounter: Payer: Self-pay | Admitting: Internal Medicine

## 2011-10-20 ENCOUNTER — Other Ambulatory Visit: Payer: Self-pay | Admitting: Internal Medicine

## 2011-10-20 DIAGNOSIS — Z95810 Presence of automatic (implantable) cardiac defibrillator: Secondary | ICD-10-CM

## 2011-10-20 DIAGNOSIS — I4901 Ventricular fibrillation: Secondary | ICD-10-CM

## 2011-10-21 LAB — REMOTE ICD DEVICE
BRDY-0002RV: 40 {beats}/min
DEV-0020ICD: NEGATIVE
RV LEAD AMPLITUDE: 12.6 mv
TZAT-0001FASTVT: 1
TZAT-0001FASTVT: 3
TZAT-0001FASTVT: 4
TZAT-0001SLOWVT: 1
TZAT-0001SLOWVT: 4
TZAT-0001SLOWVT: 5
TZAT-0002FASTVT: NEGATIVE
TZAT-0002FASTVT: NEGATIVE
TZAT-0002FASTVT: NEGATIVE
TZAT-0002SLOWVT: NEGATIVE
TZAT-0002SLOWVT: NEGATIVE
TZAT-0002SLOWVT: NEGATIVE
TZAT-0002SLOWVT: NEGATIVE
TZAT-0002SLOWVT: NEGATIVE
TZAT-0012FASTVT: 200 ms
TZAT-0012FASTVT: 200 ms
TZAT-0012FASTVT: 200 ms
TZAT-0012SLOWVT: 200 ms
TZAT-0012SLOWVT: 200 ms
TZAT-0012SLOWVT: 200 ms
TZAT-0012SLOWVT: 200 ms
TZAT-0012SLOWVT: 200 ms
TZAT-0018FASTVT: NEGATIVE
TZAT-0018FASTVT: NEGATIVE
TZAT-0018FASTVT: NEGATIVE
TZAT-0018SLOWVT: NEGATIVE
TZAT-0018SLOWVT: NEGATIVE
TZAT-0019FASTVT: 8 V
TZAT-0019FASTVT: 8 V
TZAT-0019FASTVT: 8 V
TZAT-0019FASTVT: 8 V
TZAT-0019SLOWVT: 8 V
TZAT-0019SLOWVT: 8 V
TZAT-0019SLOWVT: 8 V
TZAT-0020FASTVT: 1.6 ms
TZAT-0020FASTVT: 1.6 ms
TZAT-0020FASTVT: 1.6 ms
TZAT-0020SLOWVT: 1.6 ms
TZAT-0020SLOWVT: 1.6 ms
TZAT-0020SLOWVT: 1.6 ms
TZON-0003SLOWVT: 400 ms
TZON-0008SLOWVT: 0 ms
TZON-0010AFLUTTER: 30 ms
TZON-0010SLOWVT: 30 ms

## 2011-10-27 NOTE — Progress Notes (Signed)
Remote icd check  

## 2011-11-02 ENCOUNTER — Encounter: Payer: Self-pay | Admitting: Internal Medicine

## 2011-11-04 ENCOUNTER — Other Ambulatory Visit: Payer: Self-pay | Admitting: Internal Medicine

## 2011-11-04 DIAGNOSIS — Z1231 Encounter for screening mammogram for malignant neoplasm of breast: Secondary | ICD-10-CM

## 2011-11-09 ENCOUNTER — Encounter: Payer: Self-pay | Admitting: *Deleted

## 2011-11-21 ENCOUNTER — Ambulatory Visit
Admission: RE | Admit: 2011-11-21 | Discharge: 2011-11-21 | Disposition: A | Discharge status: Home / Self Care | Payer: Medicare Other | Source: Ambulatory Visit | Attending: Internal Medicine | Admitting: Internal Medicine

## 2011-11-21 DIAGNOSIS — Z1231 Encounter for screening mammogram for malignant neoplasm of breast: Secondary | ICD-10-CM

## 2011-11-22 ENCOUNTER — Telehealth: Payer: Self-pay | Admitting: *Deleted

## 2011-11-22 NOTE — Telephone Encounter (Signed)
[name], I have reviewed the prior colonoscopy report and pathology. The patient had a tubular adenoma and an adenoma with high-grade dysplasia. YES, she is due for surveillance colonoscopy at this time. Please make sure that her colonoscopy report and her pathology report get placed in her electronic medical record (EPIC) in the appropriate area. Thank you, Dr. Marina Goodell

## 2011-11-22 NOTE — Telephone Encounter (Signed)
Dr. Marina Goodell please review [name]'s colon report and path and advise if she needs recall colon at this time.  Her last colon was 11/07/2008 with Dr. Virginia Rochester.  Thank you,  [name]

## 2011-11-22 NOTE — Telephone Encounter (Signed)
Colon report,path and EGD report given to [facility] to scan into Epic.  [name]

## 2011-11-23 ENCOUNTER — Ambulatory Visit (AMBULATORY_SURGERY_CENTER): Payer: Medicare Other | Admitting: *Deleted

## 2011-11-23 ENCOUNTER — Encounter: Payer: Self-pay | Admitting: Internal Medicine

## 2011-11-23 VITALS — Ht 63.0 in | Wt 143.8 lb

## 2011-11-23 DIAGNOSIS — Z1211 Encounter for screening for malignant neoplasm of colon: Secondary | ICD-10-CM

## 2011-11-23 MED ORDER — MOVIPREP 100 G PO SOLR: ORAL | Status: DC

## 2011-12-06 ENCOUNTER — Encounter: Payer: Self-pay | Admitting: Internal Medicine

## 2011-12-06 ENCOUNTER — Ambulatory Visit (AMBULATORY_SURGERY_CENTER): Payer: Medicare Other | Admitting: Internal Medicine

## 2011-12-06 VITALS — BP 127/61 | HR 69 | Temp 99.4°F | Resp 18 | Ht 63.0 in | Wt 143.0 lb

## 2011-12-06 DIAGNOSIS — Z1211 Encounter for screening for malignant neoplasm of colon: Secondary | ICD-10-CM

## 2011-12-06 DIAGNOSIS — D126 Benign neoplasm of colon, unspecified: Secondary | ICD-10-CM

## 2011-12-06 DIAGNOSIS — Z86010 Personal history of colonic polyps: Secondary | ICD-10-CM

## 2011-12-06 MED ORDER — SODIUM CHLORIDE 0.9 % IV SOLN: 500.0000 mL | INTRAVENOUS | Status: DC

## 2011-12-06 NOTE — Op Note (Signed)
[facility] [address] [place], [place]  [address]  COLONOSCOPY PROCEDURE REPORT  PATIENT:  [name], [name]  MR#:  [id number] BIRTHDATE:  07-May-1941, 70 yrs. old  GENDER:  female ENDOSCOPIST:  Wilhemina Bonito. Eda Keys, MD REF. BY:  Soyla Murphy. Renne Crigler, M.D. PROCEDURE DATE:  12/06/2011 PROCEDURE:  Colonoscopy with snare polypectomy x 2 ASA CLASS:  Class III INDICATIONS:  history of pre-cancerous (adenomatous) colon polyps, surveillance and high-risk screening ; index exam 11-07-2008 (Dr. Virginia Rochester) w/ TA and HGD MEDICATIONS:   MAC sedation, administered by CRNA, propofol (Diprivan) 300 mg IV  DESCRIPTION OF PROCEDURE:   After the risks benefits and alternatives of the procedure were thoroughly explained, informed consent was obtained.  Digital rectal exam was performed and revealed no abnormalities.   The LB [id number]-[id number] endoscope was introduced through the anus and advanced to the cecum, which was identified by both the appendix and ileocecal valve, without limitations.  The quality of the prep was excellent, using MoviPrep.  The instrument was then slowly withdrawn as the colon was fully examined. <<PROCEDUREIMAGES>>  FINDINGS:  A diminutive polyp was found in the ascending colon and snared without cautery. Retrieval was successful.  A diminutive polyp was found in the rectum and snared without cautery. Retrieval was successful.  Moderate diverticulosis was found in the sigmoid colon.  Otherwise normal colonoscopy without other polyps, masses, vascular ectasias, or inflammatory changes. Retroflexed views in the rectum revealed internal hemorrhoids.  The time to cecum =  5:54  minutes. The scope was then withdrawn in 21:00  minutes from the cecum and the procedure completed.  COMPLICATIONS:  None  ENDOSCOPIC IMPRESSION: 1) Diminutive polyp in the ascending colon - removed 2) Diminutive polyp in the rectum - removed 3) Moderate diverticulosis in the sigmoid colon 4)  Otherwise normal colonoscopy  RECOMMENDATIONS: 1) Follow up colonoscopy in 5 years  ______________________________ Wilhemina Bonito. Eda Keys, MD  CC:  Romero Liner, MD; The Patient  n. eSIGNED:   Wilhemina Bonito. Eda Keys at 12/06/2011 09:48 AM  Durenda Hurt, [id number]

## 2011-12-06 NOTE — Progress Notes (Signed)
Patient did not have preoperative order for IV antibiotic SSI prophylaxis. (G8918)  Patient did not experience any of the following events: a burn prior to discharge; a fall within the facility; wrong site/side/patient/procedure/implant event; or a hospital transfer or hospital admission upon discharge from the facility. (G8907)  

## 2011-12-06 NOTE — Patient Instructions (Signed)
Please, read the handouts given to you by your recovery room nurse.  Your polyp results will be mailed to your home within two weeks.  You may resume your routine medications today.  Try to increase the fiber in your diet due to your diverticulosis.   If you have any questions, please call us at [phone].   Thank-you.

## 2011-12-07 ENCOUNTER — Telehealth: Payer: Self-pay | Admitting: *Deleted

## 2011-12-07 NOTE — Telephone Encounter (Signed)
  Follow up Call-  Call back number 12/06/2011  Post procedure Call Back phone  # [phone]  Permission to leave phone message Yes     Patient questions:  Do you have a fever, pain , or abdominal swelling? no Pain Score  0 *  Have you tolerated food without any problems? yes  Have you been able to return to your normal activities? yes  Do you have any questions about your discharge instructions: Diet   no Medications  no Follow up visit  no  Do you have questions or concerns about your Care? no  Actions: * If pain score is 4 or above: No action needed, pain <4.

## 2011-12-13 ENCOUNTER — Encounter: Payer: Self-pay | Admitting: Internal Medicine

## 2011-12-26 ENCOUNTER — Ambulatory Visit (INDEPENDENT_AMBULATORY_CARE_PROVIDER_SITE_OTHER): Payer: Medicare Other | Admitting: Internal Medicine

## 2011-12-26 ENCOUNTER — Encounter: Payer: Self-pay | Admitting: Internal Medicine

## 2011-12-26 DIAGNOSIS — I4901 Ventricular fibrillation: Secondary | ICD-10-CM

## 2011-12-26 DIAGNOSIS — Z95810 Presence of automatic (implantable) cardiac defibrillator: Secondary | ICD-10-CM

## 2011-12-26 LAB — ICD DEVICE OBSERVATION
DEV-0020ICD: NEGATIVE
FVT: 0
PACEART VT: 0
RV LEAD AMPLITUDE: 15.5 mv
RV LEAD THRESHOLD: 1 V
TZAT-0001FASTVT: 1
TZAT-0001FASTVT: 2
TZAT-0001FASTVT: 6
TZAT-0001SLOWVT: 2
TZAT-0001SLOWVT: 3
TZAT-0001SLOWVT: 4
TZAT-0002FASTVT: NEGATIVE
TZAT-0002FASTVT: NEGATIVE
TZAT-0002SLOWVT: NEGATIVE
TZAT-0002SLOWVT: NEGATIVE
TZAT-0012FASTVT: 200 ms
TZAT-0012FASTVT: 200 ms
TZAT-0012SLOWVT: 200 ms
TZAT-0012SLOWVT: 200 ms
TZAT-0018FASTVT: NEGATIVE
TZAT-0018FASTVT: NEGATIVE
TZAT-0018SLOWVT: NEGATIVE
TZAT-0018SLOWVT: NEGATIVE
TZAT-0018SLOWVT: NEGATIVE
TZAT-0018SLOWVT: NEGATIVE
TZAT-0019FASTVT: 8 V
TZAT-0019FASTVT: 8 V
TZAT-0019FASTVT: 8 V
TZAT-0019SLOWVT: 8 V
TZAT-0019SLOWVT: 8 V
TZAT-0019SLOWVT: 8 V
TZAT-0020FASTVT: 1.6 ms
TZAT-0020FASTVT: 1.6 ms
TZAT-0020FASTVT: 1.6 ms
TZAT-0020SLOWVT: 1.6 ms
TZAT-0020SLOWVT: 1.6 ms
TZAT-0020SLOWVT: 1.6 ms
TZAT-0020SLOWVT: 1.6 ms
TZON-0003FASTVT: 0 ms
TZON-0003SLOWVT: 400 ms
TZON-0004SLOWVT: 20
TZON-0005SLOWVT: 12
TZON-0008SLOWVT: 0 ms
TZON-0010FASTVT: 30 ms
TZON-0010VSLOWVT: 30 ms
TZON-0011AFLUTTER: 70
VENTRICULAR PACING ICD: 0 pct

## 2011-12-26 NOTE — Patient Instructions (Signed)
Remote monitoring is used to monitor your Pacemaker of ICD from home. This monitoring reduces the number of office visits required to check your device to one time per year. It allows [place] to keep an eye on the functioning of your device to ensure it is working properly. You are scheduled for a device check from home on 03/29/12. You may send your transmission at any time that day. If you have a wireless device, the transmission will be sent automatically. After your physician reviews your transmission, you will receive a postcard with your next transmission date.  Your physician wants you to follow-up in: 12 months with Dr. Ladona Ridgel.  You will receive a reminder letter in the mail two months in advance. If you don't receive a letter, please call our office to schedule the follow-up appointment.

## 2011-12-26 NOTE — Progress Notes (Signed)
HPI [name] returns today for followup. She is a very pleasant 71 year old woman with resuscitated ventricular fibrillation cardiac arrest. She is status post ICD implantation. She has hypertension and thyroid dysfunction. Allergies  Allergen Reactions  . Sulfonamide Derivatives Other (See Comments)    Blisters in mouth     Current Outpatient Prescriptions  Medication Sig Dispense Refill  . calcium carbonate 200 MG capsule Take 250 mg by mouth daily.      . cholecalciferol (VITAMIN D) 1000 UNITS tablet Take 1,000 Units by mouth daily.      . fish oil-omega-3 fatty acids 1000 MG capsule Take 1 g by mouth daily.      Marland Kitchen levothyroxine (SYNTHROID, LEVOTHROID) 100 MCG tablet Take 100 mcg by mouth daily.      . Multiple Vitamins-Minerals (MULTIVITAMIN WITH MINERALS) tablet Take 1 tablet by mouth daily.      . NON FORMULARY Take 70 mg by mouth once a week. Fossamax      . sertraline (ZOLOFT) 100 MG tablet Take 100 mg by mouth daily.      . vitamin E (VITAMIN E) 400 UNIT capsule Take 400 Units by mouth daily.         Past Medical History  Diagnosis Date  . Arthritis   . Arthritis   . Congestive heart failure   . Cardiac defibrillator in place 2005  . Depression   . Osteoporosis   . Thyroid disease   . Osteoporosis   . Thyroid disease   . Fibromyalgia   . Gallbladder disease     ROS:   All systems reviewed and negative except as noted in the HPI.   Past Surgical History  Procedure Date  . Colonoscopy   . Polypectomy   . Colonoscopy 2010    Adenomatous with high grade dysplasia  . Cardiac catheterization 2005  . Cardiac defibrillator placement      Family History  Problem Relation Age of Onset  . Breast cancer Sister      History   Social History  . Marital Status: Married    Spouse Name: N/A    Number of Children: N/A  . Years of Education: N/A   Occupational History  . Not on file.   Social History Main Topics  . Smoking status: Former Smoker   Quit date: 11/22/2006  . Smokeless tobacco: Never Used  . Alcohol Use: No  . Drug Use: No  . Sexually Active: Not on file   Other Topics Concern  . Not on file   Social History Narrative  . No narrative on file     BP 126/62  Pulse 72  Ht 5' 3.5" (1.613 m)  Wt 64.919 kg (143 lb 1.9 oz)  BMI 24.95 kg/m2  Physical Exam:  Well appearing NAD HEENT: Unremarkable Neck:  No JVD, no thyromegally Lymphatics:  No adenopathy Back:  No CVA tenderness Lungs:  Clear HEART:  Regular rate rhythm, no murmurs, no rubs, no clicks Abd:  soft, positive bowel sounds, no organomegally, no rebound, no guarding Ext:  2 plus pulses, no edema, no cyanosis, no clubbing Skin:  No rashes no nodules Neuro:  CN II through XII intact, motor grossly intact  EKG  DEVICE  Normal device function.  See PaceArt for details.   Assess/Plan:

## 2012-02-03 ENCOUNTER — Other Ambulatory Visit: Payer: Self-pay | Admitting: Endocrinology

## 2012-02-03 DIAGNOSIS — E041 Nontoxic single thyroid nodule: Secondary | ICD-10-CM

## 2012-03-29 ENCOUNTER — Encounter: Payer: Medicare Other | Admitting: *Deleted

## 2012-04-09 ENCOUNTER — Encounter: Payer: Self-pay | Admitting: *Deleted

## 2012-04-15 ENCOUNTER — Encounter: Payer: Self-pay | Admitting: Internal Medicine

## 2012-04-16 ENCOUNTER — Ambulatory Visit (INDEPENDENT_AMBULATORY_CARE_PROVIDER_SITE_OTHER): Payer: Medicare Other | Admitting: *Deleted

## 2012-04-16 DIAGNOSIS — I4901 Ventricular fibrillation: Secondary | ICD-10-CM

## 2012-04-16 DIAGNOSIS — Z95810 Presence of automatic (implantable) cardiac defibrillator: Secondary | ICD-10-CM

## 2012-04-18 LAB — REMOTE ICD DEVICE
BRDY-0002RV: 40 {beats}/min
CHARGE TIME: 9.82 s
DEV-0020ICD: NEGATIVE
TZAT-0001FASTVT: 4
TZAT-0001FASTVT: 5
TZAT-0001SLOWVT: 1
TZAT-0001SLOWVT: 2
TZAT-0001SLOWVT: 4
TZAT-0002FASTVT: NEGATIVE
TZAT-0002FASTVT: NEGATIVE
TZAT-0002FASTVT: NEGATIVE
TZAT-0002SLOWVT: NEGATIVE
TZAT-0002SLOWVT: NEGATIVE
TZAT-0002SLOWVT: NEGATIVE
TZAT-0012FASTVT: 200 ms
TZAT-0012FASTVT: 200 ms
TZAT-0012FASTVT: 200 ms
TZAT-0012SLOWVT: 200 ms
TZAT-0012SLOWVT: 200 ms
TZAT-0012SLOWVT: 200 ms
TZAT-0018FASTVT: NEGATIVE
TZAT-0018FASTVT: NEGATIVE
TZAT-0018FASTVT: NEGATIVE
TZAT-0018FASTVT: NEGATIVE
TZAT-0018SLOWVT: NEGATIVE
TZAT-0018SLOWVT: NEGATIVE
TZAT-0018SLOWVT: NEGATIVE
TZAT-0019FASTVT: 8 V
TZAT-0019FASTVT: 8 V
TZAT-0019FASTVT: 8 V
TZAT-0019FASTVT: 8 V
TZAT-0019SLOWVT: 8 V
TZAT-0019SLOWVT: 8 V
TZAT-0019SLOWVT: 8 V
TZAT-0020FASTVT: 1.6 ms
TZAT-0020FASTVT: 1.6 ms
TZAT-0020FASTVT: 1.6 ms
TZAT-0020SLOWVT: 1.6 ms
TZAT-0020SLOWVT: 1.6 ms
TZON-0010FASTVT: 30 ms
TZON-0011AFLUTTER: 70

## 2012-04-23 ENCOUNTER — Encounter: Payer: Self-pay | Admitting: *Deleted

## 2012-07-23 ENCOUNTER — Ambulatory Visit (INDEPENDENT_AMBULATORY_CARE_PROVIDER_SITE_OTHER): Payer: Medicare Other | Admitting: *Deleted

## 2012-07-23 ENCOUNTER — Encounter: Payer: Self-pay | Admitting: Internal Medicine

## 2012-07-23 DIAGNOSIS — I4901 Ventricular fibrillation: Secondary | ICD-10-CM

## 2012-07-24 ENCOUNTER — Ambulatory Visit
Admission: RE | Admit: 2012-07-24 | Discharge: 2012-07-24 | Disposition: A | Discharge status: Home / Self Care | Payer: Medicare Other | Source: Ambulatory Visit | Attending: Endocrinology | Admitting: Endocrinology

## 2012-07-24 DIAGNOSIS — E041 Nontoxic single thyroid nodule: Secondary | ICD-10-CM

## 2012-07-24 LAB — REMOTE ICD DEVICE
BATTERY VOLTAGE: 2.64 V
RV LEAD AMPLITUDE: 13.8 mv
TOT-0006: 20130623000000
TZAT-0001FASTVT: 4
TZAT-0001FASTVT: 5
TZAT-0001FASTVT: 6
TZAT-0001SLOWVT: 1
TZAT-0001SLOWVT: 2
TZAT-0001SLOWVT: 5
TZAT-0001SLOWVT: 6
TZAT-0002FASTVT: NEGATIVE
TZAT-0002FASTVT: NEGATIVE
TZAT-0002FASTVT: NEGATIVE
TZAT-0002FASTVT: NEGATIVE
TZAT-0002SLOWVT: NEGATIVE
TZAT-0002SLOWVT: NEGATIVE
TZAT-0002SLOWVT: NEGATIVE
TZAT-0012FASTVT: 200 ms
TZAT-0012FASTVT: 200 ms
TZAT-0012FASTVT: 200 ms
TZAT-0012SLOWVT: 200 ms
TZAT-0012SLOWVT: 200 ms
TZAT-0012SLOWVT: 200 ms
TZAT-0018FASTVT: NEGATIVE
TZAT-0018FASTVT: NEGATIVE
TZAT-0018SLOWVT: NEGATIVE
TZAT-0018SLOWVT: NEGATIVE
TZAT-0018SLOWVT: NEGATIVE
TZAT-0019FASTVT: 8 V
TZAT-0019FASTVT: 8 V
TZAT-0019FASTVT: 8 V
TZAT-0019SLOWVT: 8 V
TZAT-0019SLOWVT: 8 V
TZAT-0019SLOWVT: 8 V
TZAT-0020FASTVT: 1.6 ms
TZAT-0020FASTVT: 1.6 ms
TZAT-0020SLOWVT: 1.6 ms
TZAT-0020SLOWVT: 1.6 ms
TZAT-0020SLOWVT: 1.6 ms
TZON-0004SLOWVT: 20
TZON-0005SLOWVT: 12
TZON-0008FASTVT: 0 ms
TZON-0008SLOWVT: 0 ms
TZON-0010AFLUTTER: 30 ms
TZON-0010FASTVT: 30 ms
TZON-0010SLOWVT: 30 ms
TZON-0011AFLUTTER: 70
VENTRICULAR PACING ICD: 0 pct

## 2012-07-24 NOTE — Progress Notes (Signed)
Remote defib check  

## 2012-08-01 ENCOUNTER — Encounter: Payer: Self-pay | Admitting: *Deleted

## 2012-10-27 ENCOUNTER — Encounter: Payer: Self-pay | Admitting: Internal Medicine

## 2012-10-29 ENCOUNTER — Ambulatory Visit (INDEPENDENT_AMBULATORY_CARE_PROVIDER_SITE_OTHER): Payer: Medicare Other | Admitting: *Deleted

## 2012-10-29 DIAGNOSIS — Z950 Presence of cardiac pacemaker: Secondary | ICD-10-CM

## 2012-10-29 LAB — REMOTE ICD DEVICE
BATTERY VOLTAGE: 2.62 V
RV LEAD AMPLITUDE: 12.9 mv
TOT-0006: 20130930000000
TZAT-0001FASTVT: 2
TZAT-0001FASTVT: 3
TZAT-0001SLOWVT: 3
TZAT-0001SLOWVT: 5
TZAT-0001SLOWVT: 6
TZAT-0002FASTVT: NEGATIVE
TZAT-0002FASTVT: NEGATIVE
TZAT-0002FASTVT: NEGATIVE
TZAT-0002FASTVT: NEGATIVE
TZAT-0002SLOWVT: NEGATIVE
TZAT-0002SLOWVT: NEGATIVE
TZAT-0002SLOWVT: NEGATIVE
TZAT-0012FASTVT: 200 ms
TZAT-0012FASTVT: 200 ms
TZAT-0012FASTVT: 200 ms
TZAT-0012SLOWVT: 200 ms
TZAT-0012SLOWVT: 200 ms
TZAT-0018FASTVT: NEGATIVE
TZAT-0018SLOWVT: NEGATIVE
TZAT-0018SLOWVT: NEGATIVE
TZAT-0018SLOWVT: NEGATIVE
TZAT-0019FASTVT: 8 V
TZAT-0019FASTVT: 8 V
TZAT-0019FASTVT: 8 V
TZAT-0019SLOWVT: 8 V
TZAT-0019SLOWVT: 8 V
TZAT-0019SLOWVT: 8 V
TZAT-0019SLOWVT: 8 V
TZAT-0020FASTVT: 1.6 ms
TZAT-0020FASTVT: 1.6 ms
TZAT-0020SLOWVT: 1.6 ms
TZAT-0020SLOWVT: 1.6 ms
TZAT-0020SLOWVT: 1.6 ms
TZON-0003SLOWVT: 400 ms
TZON-0004SLOWVT: 20
TZON-0008FASTVT: 0 ms
TZON-0008SLOWVT: 0 ms
TZON-0010AFLUTTER: 30 ms
TZON-0010FASTVT: 30 ms
TZON-0010SLOWVT: 30 ms
VENTRICULAR PACING ICD: 0 pct

## 2012-11-21 ENCOUNTER — Encounter (INDEPENDENT_AMBULATORY_CARE_PROVIDER_SITE_OTHER): Payer: Medicare Other | Admitting: Internal Medicine

## 2012-12-17 ENCOUNTER — Encounter: Payer: Medicare Other | Admitting: Internal Medicine

## 2012-12-19 ENCOUNTER — Encounter: Payer: Self-pay | Admitting: Internal Medicine

## 2012-12-19 ENCOUNTER — Encounter: Payer: Self-pay | Admitting: *Deleted

## 2012-12-19 ENCOUNTER — Ambulatory Visit (INDEPENDENT_AMBULATORY_CARE_PROVIDER_SITE_OTHER): Payer: Medicare Other | Admitting: Internal Medicine

## 2012-12-19 VITALS — BP 120/81 | HR 67 | Ht 63.5 in | Wt 138.2 lb

## 2012-12-19 NOTE — Progress Notes (Signed)
HPI [name] returns today for followup. She is a very pleasant 72 year old woman with a resuscitated cardiac arrest due to ventricular fibrillation. She is now reached elective replacement. The patient has been stable with no recent ICD shocks. She denies chest pain or shortness of breath. No syncope. Allergies  Allergen Reactions  . Sulfonamide Derivatives Other (See Comments)    Blisters in mouth     Current Outpatient Prescriptions  Medication Sig Dispense Refill  . calcium carbonate 200 MG capsule Take 250 mg by mouth daily.      . cholecalciferol (VITAMIN D) 1000 UNITS tablet Take 1,000 Units by mouth daily.      . fish oil-omega-3 fatty acids 1000 MG capsule Take 1 g by mouth daily.      Marland Kitchen levothyroxine (SYNTHROID, LEVOTHROID) 100 MCG tablet Take 100 mcg by mouth daily.      . Multiple Vitamins-Minerals (MULTIVITAMIN WITH MINERALS) tablet Take 1 tablet by mouth daily.      . NON FORMULARY Take 70 mg by mouth once a week. Fossamax      . sertraline (ZOLOFT) 100 MG tablet Take 100 mg by mouth daily.      . vitamin E (VITAMIN E) 400 UNIT capsule Take 400 Units by mouth daily.       No current facility-administered medications for this visit.     Past Medical History  Diagnosis Date  . Arthritis   . Arthritis   . Congestive heart failure   . Cardiac defibrillator in place 2005  . Depression   . Osteoporosis   . Thyroid disease   . Osteoporosis   . Thyroid disease   . Fibromyalgia   . Gallbladder disease     ROS:   All systems reviewed and negative except as noted in the HPI.   Past Surgical History  Procedure Laterality Date  . Colonoscopy    . Polypectomy    . Colonoscopy  2010    Adenomatous with high grade dysplasia  . Cardiac catheterization  2005  . Cardiac defibrillator placement       Family History  Problem Relation Age of Onset  . Breast cancer Sister      History   Social History  . Marital Status: Married    Spouse Name: N/A   Number of Children: N/A  . Years of Education: N/A   Occupational History  . Not on file.   Social History Main Topics  . Smoking status: Former Smoker    Quit date: 11/22/2006  . Smokeless tobacco: Never Used  . Alcohol Use: No  . Drug Use: No  . Sexually Active: Not on file   Other Topics Concern  . Not on file   Social History Narrative  . No narrative on file     BP 120/81  Pulse 67  Ht 5' 3.5" (1.613 m)  Wt 138 lb 3.2 oz (62.687 kg)  BMI 24.09 kg/m2  SpO2 99%  Physical Exam:  Well appearing 72 year old woman,NAD HEENT: Unremarkable Neck:  6 cm JVD, no thyromegally Lungs:  Clear with no wheezes, rales, or rhonchi. HEART:  Regular rate rhythm, no murmurs, no rubs, no clicks Abd:  soft, positive bowel sounds, no organomegally, no rebound, no guarding Ext:  2 plus pulses, no edema, no cyanosis, no clubbing Skin:  No rashes no nodules Neuro:  CN II through XII intact, motor grossly intact  DEVICE  Normal device function.  See PaceArt for details. Device at elective replacement  Assess/Plan:

## 2012-12-19 NOTE — Assessment & Plan Note (Signed)
Her device is at elective replacement. We discussed the pros and cons, risks and benefits, goals and expectations, of ICD generator removal and insertion of a new device and she wishes to proceed. Because she has a Medtronic [id number] defibrillator lead, I would expect also add a new ICD lead.

## 2012-12-19 NOTE — Patient Instructions (Addendum)
You have been scheduled for a generator change and lead revision on January 18, 2013 at 10:30am. You will need to be at short stay that morning at [facility] at 8:30 am  We will send you a letter with your instructions. Also you will need lab work drawn before your procedure

## 2012-12-19 NOTE — Assessment & Plan Note (Signed)
She has had no recurrent ventricular arrhythmias. No change in medical therapy. 

## 2012-12-20 ENCOUNTER — Other Ambulatory Visit: Payer: Self-pay | Admitting: *Deleted

## 2013-01-01 ENCOUNTER — Encounter: Payer: Medicare Other | Admitting: Internal Medicine

## 2013-01-08 ENCOUNTER — Other Ambulatory Visit (INDEPENDENT_AMBULATORY_CARE_PROVIDER_SITE_OTHER): Payer: Medicare Other

## 2013-01-08 LAB — BASIC METABOLIC PANEL
BUN: 12 mg/dL (ref 6–23)
CO2: 28 mEq/L (ref 19–32)
Calcium: 9.2 mg/dL (ref 8.4–10.5)
Glucose, Bld: 99 mg/dL (ref 70–99)
Sodium: 140 mEq/L (ref 135–145)

## 2013-01-08 LAB — CBC WITH DIFFERENTIAL/PLATELET
Basophils Absolute: 0 10*3/uL (ref 0.0–0.1)
Eosinophils Absolute: 0.2 10*3/uL (ref 0.0–0.7)
HCT: 42.1 % (ref 36.0–46.0)
Hemoglobin: 14.1 g/dL (ref 12.0–15.0)
Lymphocytes Relative: 26.3 % (ref 12.0–46.0)
Lymphs Abs: 1.5 10*3/uL (ref 0.7–4.0)
MCHC: 33.6 g/dL (ref 30.0–36.0)
Neutro Abs: 3.5 10*3/uL (ref 1.4–7.7)
Platelets: 224 10*3/uL (ref 150.0–400.0)
RDW: 13.1 % (ref 11.5–14.6)

## 2013-01-10 ENCOUNTER — Encounter (HOSPITAL_COMMUNITY): Payer: Self-pay

## 2013-01-17 MED ORDER — SODIUM CHLORIDE 0.9 % IV SOLN: 250.0000 mL | INTRAVENOUS | Status: DC

## 2013-01-17 MED ORDER — GENTAMICIN SULFATE 40 MG/ML IJ SOLN
80.0000 mg | INTRAMUSCULAR | Status: DC
  Filled 2013-01-17: qty 2

## 2013-01-17 MED ORDER — SODIUM CHLORIDE 0.45 % IV SOLN
INTRAVENOUS | Status: DC
  Administered 2013-01-18: 09:00:00 via INTRAVENOUS

## 2013-01-17 MED ORDER — CEFAZOLIN SODIUM-DEXTROSE 2-3 GM-% IV SOLR
2.0000 g | INTRAVENOUS | Status: AC
  Administered 2013-01-18: 2 g via INTRAVENOUS
  Filled 2013-01-17 (×2): qty 50

## 2013-01-17 MED ORDER — SODIUM CHLORIDE 0.9 % IJ SOLN: 3.0000 mL | INTRAMUSCULAR | Status: DC | PRN

## 2013-01-17 MED ORDER — SODIUM CHLORIDE 0.9 % IJ SOLN: 3.0000 mL | Freq: Two times a day (BID) | INTRAMUSCULAR | Status: DC

## 2013-01-18 ENCOUNTER — Ambulatory Visit (HOSPITAL_COMMUNITY): Payer: Medicare Other

## 2013-01-18 ENCOUNTER — Encounter (HOSPITAL_COMMUNITY)
Admission: RE | Disposition: A | Discharge status: Home / Self Care | Payer: Self-pay | Source: Ambulatory Visit | Attending: Internal Medicine

## 2013-01-18 ENCOUNTER — Encounter: Payer: Self-pay | Admitting: *Deleted

## 2013-01-18 ENCOUNTER — Other Ambulatory Visit: Payer: Self-pay

## 2013-01-18 ENCOUNTER — Ambulatory Visit (HOSPITAL_COMMUNITY)
Admission: RE | Admit: 2013-01-18 | Discharge: 2013-01-19 | Disposition: A | Discharge status: Home / Self Care | Payer: Medicare Other | Source: Ambulatory Visit | Attending: Internal Medicine | Admitting: Internal Medicine

## 2013-01-18 ENCOUNTER — Encounter (HOSPITAL_COMMUNITY): Payer: Self-pay | Admitting: *Deleted

## 2013-01-18 DIAGNOSIS — Z95810 Presence of automatic (implantable) cardiac defibrillator: Secondary | ICD-10-CM

## 2013-01-18 DIAGNOSIS — I509 Heart failure, unspecified: Secondary | ICD-10-CM | POA: Insufficient documentation

## 2013-01-18 DIAGNOSIS — Z8674 Personal history of sudden cardiac arrest: Secondary | ICD-10-CM | POA: Insufficient documentation

## 2013-01-18 DIAGNOSIS — Z4502 Encounter for adjustment and management of automatic implantable cardiac defibrillator: Secondary | ICD-10-CM | POA: Insufficient documentation

## 2013-01-18 DIAGNOSIS — I4901 Ventricular fibrillation: Secondary | ICD-10-CM | POA: Diagnosis present

## 2013-01-18 DIAGNOSIS — I5022 Chronic systolic (congestive) heart failure: Secondary | ICD-10-CM | POA: Insufficient documentation

## 2013-01-18 DIAGNOSIS — T82198A Other mechanical complication of other cardiac electronic device, initial encounter: Secondary | ICD-10-CM

## 2013-01-18 HISTORY — PX: IMPLANTABLE CARDIOVERTER DEFIBRILLATOR GENERATOR CHANGE: SHX5859

## 2013-01-18 HISTORY — PX: IMPLANTABLE CARDIOVERTER DEFIBRILLATOR GENERATOR CHANGE: SHX5474

## 2013-01-18 HISTORY — PX: LEAD REVISION: SHX5945

## 2013-01-18 LAB — GLUCOSE, CAPILLARY

## 2013-01-18 LAB — SURGICAL PCR SCREEN: Staphylococcus aureus: NEGATIVE

## 2013-01-18 LAB — CBC
HCT: 41.5 % (ref 36.0–46.0)
MCHC: 32.5 g/dL (ref 30.0–36.0)
RDW: 12.4 % (ref 11.5–15.5)

## 2013-01-18 LAB — CREATININE, SERUM: GFR calc non Af Amer: 87 mL/min — ABNORMAL LOW (ref >90)

## 2013-01-18 SURGERY — IMPLANTABLE CARDIOVERTER DEFIBRILLATOR GENERATOR CHANGE
Anesthesia: LOCAL

## 2013-01-18 MED ORDER — LIDOCAINE HCL (PF) 1 % IJ SOLN
INTRAMUSCULAR | Status: AC
  Filled 2013-01-18: qty 60

## 2013-01-18 MED ORDER — MIDAZOLAM HCL 5 MG/5ML IJ SOLN
INTRAMUSCULAR | Status: AC
  Filled 2013-01-18: qty 5

## 2013-01-18 MED ORDER — LEVOTHYROXINE SODIUM 100 MCG PO TABS
100.0000 ug | ORAL_TABLET | Freq: Every day | ORAL | Status: DC
  Administered 2013-01-19: 100 ug via ORAL
  Filled 2013-01-18 (×2): qty 1

## 2013-01-18 MED ORDER — HEPARIN SODIUM (PORCINE) 5000 UNIT/ML IJ SOLN
5000.0000 [IU] | Freq: Three times a day (TID) | INTRAMUSCULAR | Status: DC
  Administered 2013-01-18 – 2013-01-19 (×2): 5000 [IU] via SUBCUTANEOUS
  Filled 2013-01-18 (×5): qty 1

## 2013-01-18 MED ORDER — SERTRALINE HCL 100 MG PO TABS
100.0000 mg | ORAL_TABLET | Freq: Every day | ORAL | Status: DC
  Filled 2013-01-18 (×2): qty 1

## 2013-01-18 MED ORDER — ONDANSETRON HCL 4 MG/2ML IJ SOLN: 4.0000 mg | Freq: Four times a day (QID) | INTRAMUSCULAR | Status: DC | PRN

## 2013-01-18 MED ORDER — FENTANYL CITRATE 0.05 MG/ML IJ SOLN
INTRAMUSCULAR | Status: AC
  Filled 2013-01-18: qty 2

## 2013-01-18 MED ORDER — ALENDRONATE SODIUM 70 MG PO TABS: 70.0000 mg | ORAL_TABLET | ORAL | Status: DC

## 2013-01-18 MED ORDER — ACETAMINOPHEN 325 MG PO TABS
325.0000 mg | ORAL_TABLET | ORAL | Status: DC | PRN
  Administered 2013-01-18 – 2013-01-19 (×3): 650 mg via ORAL
  Filled 2013-01-18 (×3): qty 2

## 2013-01-18 MED ORDER — MUPIROCIN 2 % EX OINT
TOPICAL_OINTMENT | Freq: Two times a day (BID) | CUTANEOUS | Status: DC
  Administered 2013-01-18: 1 via NASAL
  Filled 2013-01-18 (×2): qty 22

## 2013-01-18 MED ORDER — CHLORHEXIDINE GLUCONATE 4 % EX LIQD
60.0000 mL | Freq: Once | CUTANEOUS | Status: DC
  Filled 2013-01-18: qty 60

## 2013-01-18 MED ORDER — CEFAZOLIN SODIUM-DEXTROSE 2-3 GM-% IV SOLR
2.0000 g | Freq: Four times a day (QID) | INTRAVENOUS | Status: AC
  Administered 2013-01-18 – 2013-01-19 (×3): 2 g via INTRAVENOUS
  Filled 2013-01-18 (×3): qty 50

## 2013-01-18 NOTE — Progress Notes (Signed)
Pt reached arm over her head to get  Telephone, called greg tayor he said don't worry about it we will check it in the morning and hold light pressure to keep swelling down. The medtronic teck came to the room about 15 min. Later and checked the lead and told the pt. It was still in place

## 2013-01-18 NOTE — Interval H&P Note (Signed)
History and Physical Interval Note:  01/18/2013 10:26 AM  [name]  has presented today for surgery, with the diagnosis of eri  The various methods of treatment have been discussed with the patient and family. After consideration of risks, benefits and other options for treatment, the patient has consented to  Procedure(s): IMPLANTABLE CARDIOVERTER DEFIBRILLATOR GENERATOR CHANGE (N/A) LEAD REVISION (N/A) as a surgical intervention .  The patient's history has been reviewed, patient examined, no change in status, stable for surgery.  I have reviewed the patient's chart and labs.  Questions were answered to the patient's satisfaction.     Lewayne Bunting

## 2013-01-18 NOTE — H&P (View-Only) (Signed)
HPI [name] returns today for followup. She is a very pleasant 72 year old woman with a resuscitated cardiac arrest due to ventricular fibrillation. She is now reached elective replacement. The patient has been stable with no recent ICD shocks. She denies chest pain or shortness of breath. No syncope. Allergies  Allergen Reactions  . Sulfonamide Derivatives Other (See Comments)    Blisters in mouth     Current Outpatient Prescriptions  Medication Sig Dispense Refill  . calcium carbonate 200 MG capsule Take 250 mg by mouth daily.      . cholecalciferol (VITAMIN D) 1000 UNITS tablet Take 1,000 Units by mouth daily.      . fish oil-omega-3 fatty acids 1000 MG capsule Take 1 g by mouth daily.      Marland Kitchen levothyroxine (SYNTHROID, LEVOTHROID) 100 MCG tablet Take 100 mcg by mouth daily.      . Multiple Vitamins-Minerals (MULTIVITAMIN WITH MINERALS) tablet Take 1 tablet by mouth daily.      . NON FORMULARY Take 70 mg by mouth once a week. Fossamax      . sertraline (ZOLOFT) 100 MG tablet Take 100 mg by mouth daily.      . vitamin E (VITAMIN E) 400 UNIT capsule Take 400 Units by mouth daily.       No current facility-administered medications for this visit.     Past Medical History  Diagnosis Date  . Arthritis   . Arthritis   . Congestive heart failure   . Cardiac defibrillator in place 2005  . Depression   . Osteoporosis   . Thyroid disease   . Osteoporosis   . Thyroid disease   . Fibromyalgia   . Gallbladder disease     ROS:   All systems reviewed and negative except as noted in the HPI.   Past Surgical History  Procedure Laterality Date  . Colonoscopy    . Polypectomy    . Colonoscopy  2010    Adenomatous with high grade dysplasia  . Cardiac catheterization  2005  . Cardiac defibrillator placement       Family History  Problem Relation Age of Onset  . Breast cancer Sister      History   Social History  . Marital Status: Married    Spouse Name: N/A   Number of Children: N/A  . Years of Education: N/A   Occupational History  . Not on file.   Social History Main Topics  . Smoking status: Former Smoker    Quit date: 11/22/2006  . Smokeless tobacco: Never Used  . Alcohol Use: No  . Drug Use: No  . Sexually Active: Not on file   Other Topics Concern  . Not on file   Social History Narrative  . No narrative on file     BP 120/81  Pulse 67  Ht 5' 3.5" (1.613 m)  Wt 138 lb 3.2 oz (62.687 kg)  BMI 24.09 kg/m2  SpO2 99%  Physical Exam:  Well appearing 72 year old woman,NAD HEENT: Unremarkable Neck:  6 cm JVD, no thyromegally Lungs:  Clear with no wheezes, rales, or rhonchi. HEART:  Regular rate rhythm, no murmurs, no rubs, no clicks Abd:  soft, positive bowel sounds, no organomegally, no rebound, no guarding Ext:  2 plus pulses, no edema, no cyanosis, no clubbing Skin:  No rashes no nodules Neuro:  CN II through XII intact, motor grossly intact  DEVICE  Normal device function.  See PaceArt for details. Device at elective replacement  Assess/Plan:

## 2013-01-18 NOTE — Progress Notes (Signed)

## 2013-01-19 ENCOUNTER — Ambulatory Visit (HOSPITAL_COMMUNITY): Payer: Medicare Other

## 2013-01-19 DIAGNOSIS — Z95810 Presence of automatic (implantable) cardiac defibrillator: Secondary | ICD-10-CM

## 2013-01-19 LAB — GLUCOSE, CAPILLARY

## 2013-01-19 NOTE — Discharge Summary (Signed)
CARDIOLOGY DISCHARGE SUMMARY   Patient ID: [name] MRN: [id number] DOB/AGE: April 25, 1941 72 y.o.  Admit date: 01/18/2013 Discharge date: 01/19/2013  Primary Discharge Diagnosis:   IMPLANTATION OF DEFIBRILLATOR, HX OF Secondary Discharge Diagnosis:    Ventricular fibrillation, Hx of    Procedures:  Insertion of a new active fixation defibrillation  lead, removal of a previous implanted ICD, which had reached elective  replacement, and insertion of a new ICD with left upper extremity  contrast venography and defibrillation threshold testing.  Hospital Course: [name] is a 71 y.o. female with a history of a V. fib arrest in 2005 and prolonged QT interval but with preserved left ventricular function and no significant coronary artery disease. She had a defibrillator inserted at that time. Her defibrillator was at elective replacement indicated and she came to the hospital for the procedure on 01/18/2013.  She had procedure listed below and tolerated it well. Post-procedure she had a hematoma which was treated with direct pressure.   On 01/19/2013, she was ambulating without chest pain or shortness of breath. She was advised to pay close attention to the hematoma and call if it increased in size. A post procedure chest x-ray was performed with results reviewed below. [name] was evaluated by Dr. Diona Browner and considered stable for discharge, to follow up as an outpatient.  Labs:   Lab Results  Component Value Date   WBC 4.1 01/18/2013   HGB 13.5 01/18/2013   HCT 41.5 01/18/2013   MCV 92.2 01/18/2013   PLT 184 01/18/2013     Recent Labs Lab 01/18/13 1530  CREATININE 0.67   Radiology: Dg Chest 2 View 01/19/2013  *RADIOLOGY REPORT*  Clinical Data: Status post AICD lead revision.  CHEST - 2 VIEW  Comparison: Yesterday.  Findings: Interval placement of two left subclavian AICD lead with their tips at the right ventricular apex. No pneumothorax.  Normal sized heart.  Clear  lungs with normal vascularity.  Mild thoracic spine degenerative changes.  Cholecystectomy clips.  IMPRESSION: Left subclavian AICD lead tips at the right ventricular apex.  No pneumothorax.   Original Report Authenticated By: Beckie Salts, M.D.    Dg Chest 2 View 01/18/2013  *RADIOLOGY REPORT*  Clinical Data: Preoperative evaluation for defibrillator exchange  CHEST - 2 VIEW  Comparison: 12/24/2003  Findings: A left-sided defibrillator is again noted.  The heart and pulmonary vascularity are within normal limits.  The lungs are clear bilaterally.  IMPRESSION: No acute abnormality is seen.   Original Report Authenticated By: Alcide Clever, M.D.     Op note: 01/18/2013 RESULTS: This demonstrate successful removal of her previously  implanted Medtronic single-chamber defibrillator, which had reached  elective replacement followed by successful insertion of a new Medtronic  active fixation single coil defibrillation lead, and insertion of a new  Medtronic single-chamber defibrillator with defibrillation threshold  testing. Contrast venography was carried out prior to the procedure  EKG:  19-Jan-2013 02:56:04  Normal sinus rhythm Normal ECG 91mm/s 29mm/mV 100Hz  8.0.1 12SL 241 HD CID: 1 Referred by: MD Doylene Canning TAYLOR Unconfirmed Vent. rate 62 BPM PR interval 172 ms QRS duration 90 ms QT/QTc 410/416 ms P-R-T axes 73 69 60  FOLLOW UP PLANS AND APPOINTMENTS Allergies  Allergen Reactions  . Sulfonamide Derivatives Other (See Comments)    Blisters in mouth     Medication List    TAKE these medications       alendronate 70 MG tablet  Commonly known as:  FOSAMAX  Take 70 mg by mouth every Thursday. Take with a full glass of water on an empty stomach.     fish oil-omega-3 fatty acids 1000 MG capsule  Take 1 g by mouth daily.     levothyroxine 100 MCG tablet  Commonly known as:  SYNTHROID, LEVOTHROID  Take 100 mcg by mouth daily.     multivitamin with minerals tablet  Take 1 tablet by  mouth daily.     sertraline 100 MG tablet  Commonly known as:  ZOLOFT  Take 100 mg by mouth daily.        Discharge Orders   Future Appointments Provider Department Dept Phone   01/30/2013 4:30 PM [facility] Main Office [place]) [phone]   Future Orders Complete By Expires     Diet - low sodium heart healthy  As directed     Increase activity slowly  As directed       Follow-up Information   Follow up with [facility] On 01/30/2013. (Device clinic appointment at 4:30 PM)    Contact information:   [address] [place] [address] [phone]       Follow up with Lewayne Bunting, MD. (See in 3 months)    Contact information:   [address] [place] [address] [phone]       BRING ALL MEDICATIONS WITH YOU TO FOLLOW UP APPOINTMENTS  Time spent with patient to include physician time: 35 min Signed: Theodore Demark, PA-C 01/19/2013, 10:55 AM Co-Sign MD

## 2013-01-19 NOTE — Progress Notes (Signed)
   Primary cardiologist: Dr. Lewayne Bunting  Subjective:   No complaints except somewhat frustrated that she was not discharged "at 9 o'clock." Husband indicates he was told this by Dr. Ladona Ridgel yesterday.   Objective:   Temp:  [97.7 F (36.5 C)-98.7 F (37.1 C)] 98.3 F (36.8 C) (03/29 0407) Pulse Rate:  [61-88] 63 (03/29 0407) Resp:  [18-21] 21 (03/29 0407) BP: (119-139)/(58-71) 119/62 mmHg (03/29 0407) SpO2:  [96 %-100 %] 96 % (03/29 0407) Last BM Date: 01/17/13  Filed Weights   01/17/13 1500 01/18/13 0854  Weight: 138 lb 3.7 oz (62.7 kg) 138 lb (62.596 kg)    Telemetry: Patient already removed.  Exam:  General: NAD. Fully clothed, ambulating in room.  Lab Results:  Basic Metabolic Panel:  Recent Labs Lab 01/18/13 1530  CREATININE 0.67    CBC:  Recent Labs Lab 01/18/13 1530  WBC 4.1  HGB 13.5  HCT 41.5  MCV 92.2  PLT 184    ECG: Sinus rhythm.   Medications:   Scheduled Medications: . heparin subcutaneous  5,000 Units Subcutaneous Q8H  . levothyroxine  100 mcg Oral QAC breakfast  . sertraline  100 mg Oral Daily      PRN Medications:  acetaminophen, ondansetron (ZOFRAN) IV   Assessment:   1. Status post Medtronic ICD generator change by Dr. Ladona Ridgel 3/28. Device interrogated and CXR stable.  2. History of chronic systolic CHF, clinically stable.   Plan/Discussion:    Discussed with patient and husband. Apologized that her discharge was not at 9 AM, however explained that this information had not been conveyed to me, and that Saturday mornings were typically busy and not predictable in terms of discharge times. They voiced understanding. She will need to continue present medications and followup for wound check in 1 week, EP device checks thereafter.   Jonelle Sidle, M.D., F.A.C.C.

## 2013-01-19 NOTE — Progress Notes (Signed)
Discharged to home with family office visits in place teaching done  

## 2013-01-19 NOTE — Op Note (Signed)
[name], [name]               ACCOUNT NO.:  [id number]  MEDICAL RECORD NO.:  [id number]  LOCATION:  2023                         FACILITY:  [facility]  PHYSICIAN:  Doylene Canning. Ladona Ridgel, MD    DATE OF BIRTH:  11-24-1940  DATE OF PROCEDURE:  01/18/2013 DATE OF DISCHARGE:                              OPERATIVE REPORT   PROCEDURE PERFORMED:  Insertion of a new active fixation defibrillation lead, removal of a previous implanted ICD, which had reached elective replacement, and insertion of a new ICD with left upper extremity contrast venography and defibrillation threshold testing.  INTRODUCTION:  The patient is a 72 year old woman with longstanding history of chronic systolic heart failure, status post VF arrest.  She is status post ICD insertion and has reached elective replacement on her device.  She is now referred for removal of her old generator and insertion of a new ICD lead as she has a Medtronic [phone] lead highly likely to fracture.  PROCEDURE:  After informed consent was obtained, the patient was taken to the Diagnostic EP Lab in the fasting state.  After usual preparation and draping, intravenous fentanyl and midazolam was given for sedation. A 30 mL of lidocaine was infiltrated into the left infraclavicular region.  A 7-cm incision was carried out over this region and electrocautery was utilized to dissect down to the fascial plane.  Prior to this, the left upper extremity venography demonstrated that the left subclavian vein was in fact patent.  The vein was then punctured and the Medtronic model 6935M 55-cm active fixation single coil defibrillation lead, serial number [id number] was advanced by way of the subclavian vein into the right ventricle.  Mapping was carried out in the final site, the R-wave was measured 8 millivolts.  The pacing impedance with lead actively fixed was 780 ohms and threshold of 0.8 volts at 0.5 milliseconds.  Prominent injury current was present with  active fixation on the lead.  With these satisfactory parameters, the lead was secured to the subpectoral fascia with a figure-of-eight silk suture.  The sewing sleeve was secured with silk suture.  Electrocautery was utilized to make subcutaneous pocket.  Antibiotic irrigation was utilized to irrigate the pocket and electrocautery was utilized to assure hemostasis.  The Medtronic Evera XT VR, serial number [id number] was connected to the new defibrillation lead and placed back in the subcutaneous pocket.  The old lead was capped.  The pocket was irrigated with antibiotic irrigation, electrocautery was utilized to assure hemostasis, and the incision was closed with 2-0 and 3-0 Vicryl.  At this point, I scrubbed out of the case to supervise defibrillation threshold testing.  After the patient was more deeply sedated with fentanyl and Versed under my direct supervision, VF was induced with a T-wave shock.  A 20-joule shock was then delivered which terminated ventricular fibrillation and restored sinus rhythm.  At this point, no additional defibrillation threshold testing was carried out, and benzoin and Steri-Strips were painted on the skin.  A pressure dressing applied and the patient returned to her room in satisfactory condition.  COMPLICATIONS:  There were no immediate procedure complications.  RESULTS:  This demonstrate successful removal of her previously implanted Medtronic  single-chamber defibrillator, which had reached elective replacement followed by successful insertion of a new Medtronic active fixation single coil defibrillation lead, and insertion of a new Medtronic single-chamber defibrillator with defibrillation threshold testing.  Contrast venography was carried out prior to the procedure.     Doylene Canning. Ladona Ridgel, MD     GWT/MEDQ  D:  01/18/2013  T:  01/19/2013  Job:  [id number]

## 2013-01-19 NOTE — Discharge Summary (Signed)
Please see my rounding note. 

## 2013-01-30 ENCOUNTER — Ambulatory Visit (INDEPENDENT_AMBULATORY_CARE_PROVIDER_SITE_OTHER): Payer: Medicare Other | Admitting: *Deleted

## 2013-01-30 ENCOUNTER — Other Ambulatory Visit: Payer: Self-pay | Admitting: Internal Medicine

## 2013-01-30 DIAGNOSIS — I4901 Ventricular fibrillation: Secondary | ICD-10-CM

## 2013-01-30 LAB — ICD DEVICE OBSERVATION
BRDY-0002RV: 40 {beats}/min
DEV-0020ICD: NEGATIVE
RV LEAD AMPLITUDE: 14.5 mv
RV LEAD THRESHOLD: 1 V
VENTRICULAR PACING ICD: 0.1 pct

## 2013-01-30 NOTE — Progress Notes (Signed)
Wound check icd in clinic

## 2013-02-04 ENCOUNTER — Telehealth: Payer: Self-pay | Admitting: Internal Medicine

## 2013-02-04 NOTE — Telephone Encounter (Signed)
New Prob    Lead was replaced but a reason was not provided. Calling to request the reason for replacement. Would like to speak to nurse.

## 2013-02-04 NOTE — Telephone Encounter (Signed)
Spoke with Wes from [employer] for lead replacement info.

## 2013-02-22 ENCOUNTER — Encounter: Payer: Self-pay | Admitting: Internal Medicine

## 2013-04-04 ENCOUNTER — Encounter: Payer: Self-pay | Admitting: Internal Medicine

## 2013-04-04 ENCOUNTER — Ambulatory Visit (INDEPENDENT_AMBULATORY_CARE_PROVIDER_SITE_OTHER): Payer: Medicare Other | Admitting: Internal Medicine

## 2013-04-04 VITALS — BP 122/70 | HR 67 | Wt 143.0 lb

## 2013-04-04 DIAGNOSIS — I4901 Ventricular fibrillation: Secondary | ICD-10-CM

## 2013-04-04 LAB — ICD DEVICE OBSERVATION
BRDY-0002RV: 40 {beats}/min
DEV-0020ICD: NEGATIVE
HV IMPEDENCE: 61 Ohm
RV LEAD AMPLITUDE: 16.1 mv
RV LEAD IMPEDENCE ICD: 532 Ohm
RV LEAD THRESHOLD: 0.75 v
VENTRICULAR PACING ICD: 0.1 pct

## 2013-04-04 NOTE — Progress Notes (Signed)
HPI Mrs. Elmes returns today for followup. She is a pleasant elderly woman with an unexplained VF arrest, s/p rescucitation. In the interim, she denies chest pain, sob, or syncope. No ICD shock.  Allergies  Allergen Reactions  . Sulfonamide Derivatives Other (See Comments)    Blisters in mouth     Current Outpatient Prescriptions  Medication Sig Dispense Refill  . fish oil-omega-3 fatty acids 1000 MG capsule Take 1 g by mouth daily.      Marland Kitchen levothyroxine (SYNTHROID, LEVOTHROID) 100 MCG tablet Take 100 mcg by mouth daily.      . Multiple Vitamins-Minerals (MULTIVITAMIN WITH MINERALS) tablet Take 1 tablet by mouth daily.      . sertraline (ZOLOFT) 100 MG tablet Take 100 mg by mouth daily.       No current facility-administered medications for this visit.     Past Medical History  Diagnosis Date  . Arthritis   . Arthritis   . Congestive heart failure   . Cardiac defibrillator in place 2005  . Depression   . Osteoporosis   . Thyroid disease   . Osteoporosis   . Thyroid disease   . Fibromyalgia   . Gallbladder disease     ROS:   All systems reviewed and negative except as noted in the HPI.   Past Surgical History  Procedure Laterality Date  . Colonoscopy    . Polypectomy    . Colonoscopy  2010    Adenomatous with high grade dysplasia  . Cardiac catheterization  2005  . Cardiac defibrillator placement  12-23-2003    MDT single chamber ICD implanted by Dr Graciela Husbands [phone] lead)  . Implantable cardioverter defibrillator generator change  01-18-2013    MDT ICD gen change with insertion of new RV lead, capping of 6949 lead by Dr Ladona Ridgel     Family History  Problem Relation Age of Onset  . Breast cancer Sister      History   Social History  . Marital Status: Married    Spouse Name: N/A    Number of Children: N/A  . Years of Education: N/A   Occupational History  . Not on file.   Social History Main Topics  . Smoking status: Former Smoker    Quit date: 11/22/2006   . Smokeless tobacco: Never Used  . Alcohol Use: No  . Drug Use: No  . Sexually Active: Not on file   Other Topics Concern  . Not on file   Social History Narrative  . No narrative on file     BP 122/70  Pulse 67  Wt 143 lb (64.864 kg)  BMI 24.93 kg/m2  Physical Exam:  Well appearing 72 yo woman HEENT: Unremarkable Neck:  7 cm JVD, no thyromegally Lungs:  Clear with no wheezes, rales, or rhonchi HEART:  Regular rate rhythm, no murmurs, no rubs, no clicks Abd:  soft, positive bowel sounds, no organomegally, no rebound, no guarding Ext:  2 plus pulses, no edema, no cyanosis, no clubbing Skin:  No rashes no nodules Neuro:  CN II through XII intact, motor grossly intact  DEVICE  Normal device function.  See PaceArt for details.   Assess/Plan:

## 2013-04-04 NOTE — Patient Instructions (Signed)
Remote monitoring is used to monitor your Pacemaker of ICD from home. This monitoring reduces the number of office visits required to check your device to one time per year. It allows [place] to keep an eye on the functioning of your device to ensure it is working properly. You are scheduled for a device check from home on 07/08/13. You may send your transmission at any time that day. If you have a wireless device, the transmission will be sent automatically. After your physician reviews your transmission, you will receive a postcard with your next transmission date.  Your physician wants you to follow-up in: March 2015 with Dr. Ladona Ridgel. You will receive a reminder letter in the mail two months in advance. If you don't receive a letter, please call our office to schedule the follow-up appointment.  Your physician recommends that you continue on your current medications as directed. Please refer to the Current Medication list given to you today.

## 2013-04-25 ENCOUNTER — Encounter: Payer: Self-pay | Admitting: Internal Medicine

## 2013-05-20 ENCOUNTER — Other Ambulatory Visit: Payer: Self-pay | Admitting: Endocrinology

## 2013-05-20 DIAGNOSIS — E049 Nontoxic goiter, unspecified: Secondary | ICD-10-CM

## 2013-07-08 ENCOUNTER — Ambulatory Visit (INDEPENDENT_AMBULATORY_CARE_PROVIDER_SITE_OTHER): Payer: Medicare Other | Admitting: *Deleted

## 2013-07-08 DIAGNOSIS — I4901 Ventricular fibrillation: Secondary | ICD-10-CM

## 2013-07-08 DIAGNOSIS — Z95810 Presence of automatic (implantable) cardiac defibrillator: Secondary | ICD-10-CM

## 2013-07-10 LAB — REMOTE ICD DEVICE
CHARGE TIME: 3.803 s
HV IMPEDENCE: 81 Ohm
PACEART VT: 0
RV LEAD AMPLITUDE: 14.3 mv
TOT-0002: 0
TOT-0006: 20140328000000
TZAT-0002SLOWVT: NEGATIVE
TZAT-0018SLOWVT: NEGATIVE
TZAT-0019FASTVT: 8 V
TZAT-0019SLOWVT: 8 V
TZAT-0020FASTVT: 1.5 ms
TZON-0003SLOWVT: 360 ms
TZON-0004SLOWVT: 16
TZON-0004VSLOWVT: 32
TZON-0005SLOWVT: 12
TZST-0001FASTVT: 2
TZST-0001FASTVT: 3
TZST-0001SLOWVT: 2
TZST-0001SLOWVT: 6
TZST-0002FASTVT: NEGATIVE
TZST-0002FASTVT: NEGATIVE
TZST-0002SLOWVT: NEGATIVE
TZST-0002SLOWVT: NEGATIVE
TZST-0002SLOWVT: NEGATIVE
VENTRICULAR PACING ICD: 0.01 pct

## 2013-07-23 ENCOUNTER — Encounter: Payer: Self-pay | Admitting: *Deleted

## 2013-07-27 ENCOUNTER — Encounter: Payer: Self-pay | Admitting: Internal Medicine

## 2013-07-29 ENCOUNTER — Ambulatory Visit
Admission: RE | Admit: 2013-07-29 | Discharge: 2013-07-29 | Disposition: A | Discharge status: Home / Self Care | Payer: Medicare Other | Source: Ambulatory Visit | Attending: Endocrinology | Admitting: Endocrinology

## 2013-07-29 DIAGNOSIS — E049 Nontoxic goiter, unspecified: Secondary | ICD-10-CM

## 2013-10-14 ENCOUNTER — Ambulatory Visit (INDEPENDENT_AMBULATORY_CARE_PROVIDER_SITE_OTHER): Payer: Medicare Other | Admitting: *Deleted

## 2013-10-14 DIAGNOSIS — I4901 Ventricular fibrillation: Secondary | ICD-10-CM

## 2013-10-14 LAB — MDC_IDC_ENUM_SESS_TYPE_REMOTE
Battery Remaining Longevity: 134 mo
Battery Voltage: 3.07 V
Brady Statistic RV Percent Paced: 0.01 %
HIGH POWER IMPEDANCE MEASURED VALUE: 190 Ohm
HIGH POWER IMPEDANCE MEASURED VALUE: 68 Ohm
Lead Channel Pacing Threshold Amplitude: 0.875 V
Lead Channel Setting Pacing Pulse Width: 0.4 ms
Lead Channel Setting Sensing Sensitivity: 0.3 mV
MDC IDC MSMT LEADCHNL RV IMPEDANCE VALUE: 456 Ohm
MDC IDC MSMT LEADCHNL RV PACING THRESHOLD PULSEWIDTH: 0.4 ms
MDC IDC MSMT LEADCHNL RV SENSING INTR AMPL: 14.875 mV
MDC IDC SESS DTM: 20141222162559
MDC IDC SET LEADCHNL RV PACING AMPLITUDE: 2.5 V
Zone Setting Detection Interval: 270 ms
Zone Setting Detection Interval: 360 ms
Zone Setting Detection Interval: 360 ms

## 2013-11-06 ENCOUNTER — Encounter: Payer: Self-pay | Admitting: *Deleted

## 2013-11-12 ENCOUNTER — Encounter: Payer: Self-pay | Admitting: Internal Medicine

## 2014-01-15 ENCOUNTER — Ambulatory Visit (INDEPENDENT_AMBULATORY_CARE_PROVIDER_SITE_OTHER): Payer: Medicare Other | Admitting: *Deleted

## 2014-01-15 DIAGNOSIS — I469 Cardiac arrest, cause unspecified: Secondary | ICD-10-CM

## 2014-01-15 DIAGNOSIS — I4901 Ventricular fibrillation: Secondary | ICD-10-CM

## 2014-01-16 LAB — MDC_IDC_ENUM_SESS_TYPE_REMOTE
HighPow Impedance: 73 Ohm
Lead Channel Impedance Value: 475 Ohm
Lead Channel Pacing Threshold Pulse Width: 0.4 ms
Lead Channel Sensing Intrinsic Amplitude: 14.3 mV
Lead Channel Setting Pacing Amplitude: 2.5 V
MDC IDC MSMT LEADCHNL RV PACING THRESHOLD AMPLITUDE: 0.875 V
MDC IDC SET LEADCHNL RV PACING PULSEWIDTH: 0.4 ms
MDC IDC SET LEADCHNL RV SENSING SENSITIVITY: 0.3 mV
MDC IDC SET ZONE DETECTION INTERVAL: 270 ms
MDC IDC SET ZONE DETECTION INTERVAL: 360 ms
MDC IDC SET ZONE DETECTION INTERVAL: 360 ms
MDC IDC STAT BRADY RV PERCENT PACED: 0.1 % — AB

## 2014-01-22 ENCOUNTER — Encounter: Payer: Self-pay | Admitting: *Deleted

## 2014-01-31 ENCOUNTER — Encounter: Payer: Self-pay | Admitting: Internal Medicine

## 2014-04-10 ENCOUNTER — Ambulatory Visit (INDEPENDENT_AMBULATORY_CARE_PROVIDER_SITE_OTHER): Payer: Medicare Other | Admitting: Internal Medicine

## 2014-04-10 ENCOUNTER — Encounter: Payer: Self-pay | Admitting: Internal Medicine

## 2014-04-10 VITALS — BP 110/64 | HR 71 | Ht 63.5 in | Wt 149.0 lb

## 2014-04-10 DIAGNOSIS — I4901 Ventricular fibrillation: Secondary | ICD-10-CM

## 2014-04-10 LAB — MDC_IDC_ENUM_SESS_TYPE_INCLINIC
Battery Remaining Longevity: 131 mo
Brady Statistic RV Percent Paced: 0.01 %
Date Time Interrogation Session: 20150618161803
HIGH POWER IMPEDANCE MEASURED VALUE: 73 Ohm
HighPow Impedance: 247 Ohm
Lead Channel Impedance Value: 551 Ohm
Lead Channel Pacing Threshold Amplitude: 1 V
Lead Channel Pacing Threshold Pulse Width: 0.4 ms
Lead Channel Sensing Intrinsic Amplitude: 16.875 mV
Lead Channel Setting Pacing Amplitude: 2.5 V
MDC IDC MSMT BATTERY VOLTAGE: 3.04 V
MDC IDC MSMT LEADCHNL RV SENSING INTR AMPL: 13.875 mV
MDC IDC SET LEADCHNL RV PACING PULSEWIDTH: 0.4 ms
MDC IDC SET LEADCHNL RV SENSING SENSITIVITY: 0.3 mV
MDC IDC SET ZONE DETECTION INTERVAL: 270 ms
Zone Setting Detection Interval: 360 ms
Zone Setting Detection Interval: 360 ms

## 2014-04-10 NOTE — Patient Instructions (Signed)
Remote monitoring is used to monitor your ICD from home. This monitoring reduces the number of office visits required to check your device to one time per year. It allows [place] to keep an eye on the functioning of your device to ensure it is working properly. You are scheduled for a device check from home on 07-14-2014. You may send your transmission at any time that day. If you have a wireless device, the transmission will be sent automatically. After your physician reviews your transmission, you will receive a postcard with your next transmission date.  Your physician recommends that you schedule a follow-up appointment in: 12 months with Dr.Taylor

## 2014-04-10 NOTE — Progress Notes (Signed)
HPI Mrs. Rendall returns today for followup. She is a pleasant elderly woman with an unexplained VF arrest, s/p rescucitation. In the interim, she denies chest pain, sob, or syncope. No ICD shock. She has minimal discomfort over her ICD incision but no redness or fluctuance.  Allergies  Allergen Reactions  . Sulfonamide Derivatives Other (See Comments)    Blisters in mouth     Current Outpatient Prescriptions  Medication Sig Dispense Refill  . fish oil-omega-3 fatty acids 1000 MG capsule Take 1 g by mouth daily.      Marland Kitchen levothyroxine (SYNTHROID, LEVOTHROID) 112 MCG tablet Take 112 mcg by mouth daily before breakfast.      . Multiple Vitamins-Minerals (MULTIVITAMIN WITH MINERALS) tablet Take 1 tablet by mouth daily.      Marland Kitchen rOPINIRole (REQUIP) 0.5 MG tablet Take 1 tablet at noon and 1 tablet 1-3 hours before bedtime      . sertraline (ZOLOFT) 100 MG tablet Take 100 mg by mouth daily.       No current facility-administered medications for this visit.     Past Medical History  Diagnosis Date  . Arthritis   . Arthritis   . Congestive heart failure   . Cardiac defibrillator in place 2005  . Depression   . Osteoporosis   . Thyroid disease   . Osteoporosis   . Thyroid disease   . Fibromyalgia   . Gallbladder disease     ROS:   All systems reviewed and negative except as noted in the HPI.   Past Surgical History  Procedure Laterality Date  . Colonoscopy    . Polypectomy    . Colonoscopy  2010    Adenomatous with high grade dysplasia  . Cardiac catheterization  2005  . Cardiac defibrillator placement  12-23-2003    MDT single chamber ICD implanted by Dr Caryl Comes [phone] lead)  . Implantable cardioverter defibrillator generator change  01-18-2013    MDT ICD gen change with insertion of new RV lead, capping of 6949 lead by Dr Lovena Le     Family History  Problem Relation Age of Onset  . Breast cancer Sister      History   Social History  . Marital Status: Married    Spouse  Name: N/A    Number of Children: N/A  . Years of Education: N/A   Occupational History  . Not on file.   Social History Main Topics  . Smoking status: Former Smoker    Quit date: 11/22/2006  . Smokeless tobacco: Never Used  . Alcohol Use: No  . Drug Use: No  . Sexual Activity: Not on file   Other Topics Concern  . Not on file   Social History Narrative  . No narrative on file     BP 110/64  Pulse 71  Ht 5' 3.5" (1.613 m)  Wt 149 lb (67.586 kg)  BMI 25.98 kg/m2  Physical Exam:  Well appearing 73 yo woman HEENT: Unremarkable Neck:  7 cm JVD, no thyromegally Lungs:  Clear with no wheezes, rales, or rhonchi HEART:  Regular rate rhythm, no murmurs, no rubs, no clicks Abd:  soft, positive bowel sounds, no organomegally, no rebound, no guarding Ext:  2 plus pulses, no edema, no cyanosis, no clubbing Skin:  No rashes no nodules Neuro:  CN II through XII intact, motor grossly intact  DEVICE  Normal device function.  See PaceArt for details.   Assess/Plan:

## 2014-04-17 ENCOUNTER — Encounter: Payer: Self-pay | Admitting: Internal Medicine

## 2014-05-14 IMAGING — US US SOFT TISSUE HEAD/NECK
1 series · 14 of 25 positions shown · non-contrast
Comparison: 07/24/2012 and earlier studies

CLINICAL DATA: Goiter, nodules. Previous FNA biopsy of dominant
left lesion 12/02/2008.

EXAM:
THYROID ULTRASOUND
TECHNIQUE: Ultrasound examination of the thyroid gland and adjacent soft
tissues was performed.

[Series 1: us soft tissue head/neck · 0.06mm/px · 14 of 49 slices shown]
[im 1/49]
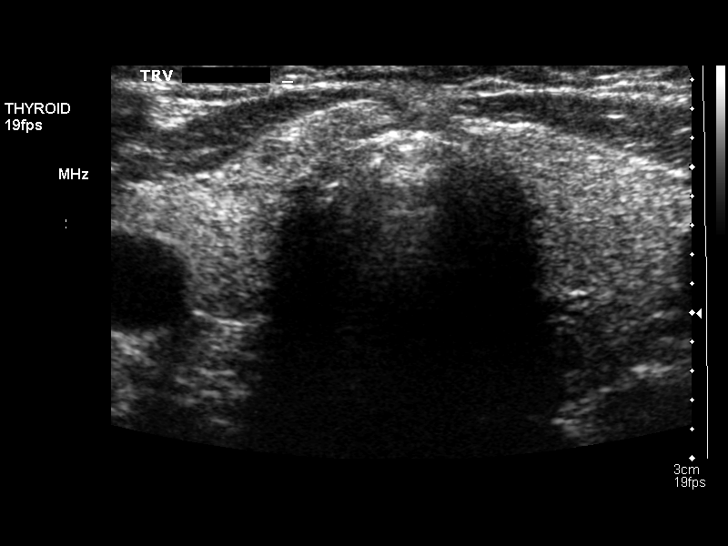
[im 5/49]
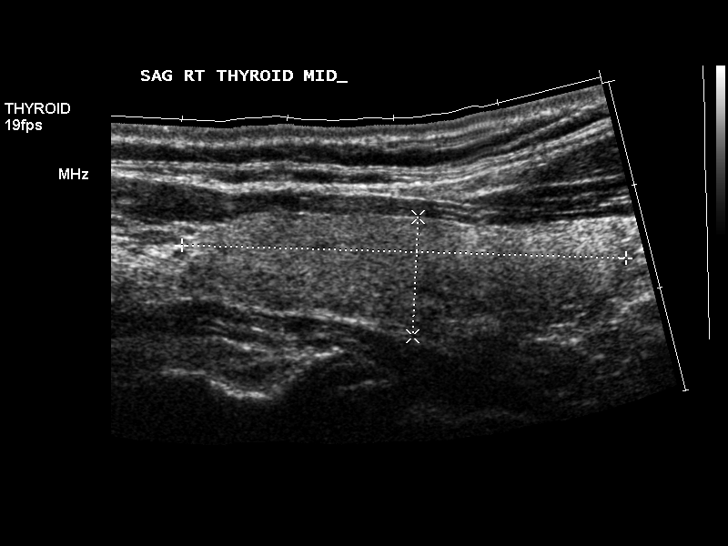
[im 9/49]
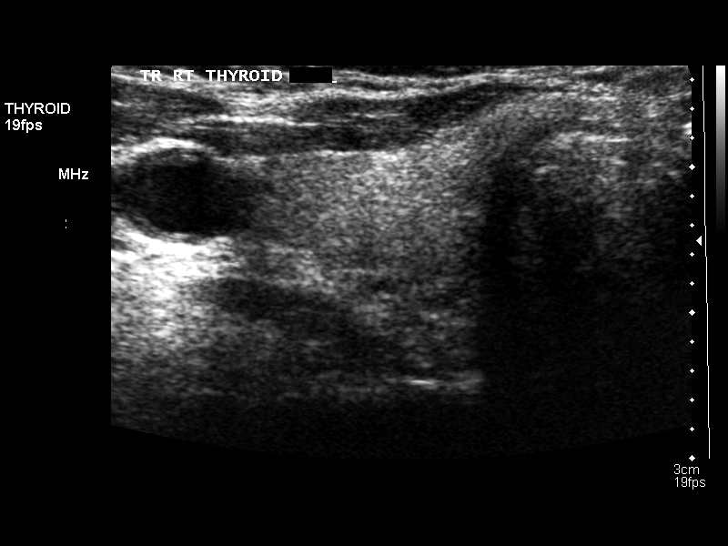
[im 13/49]
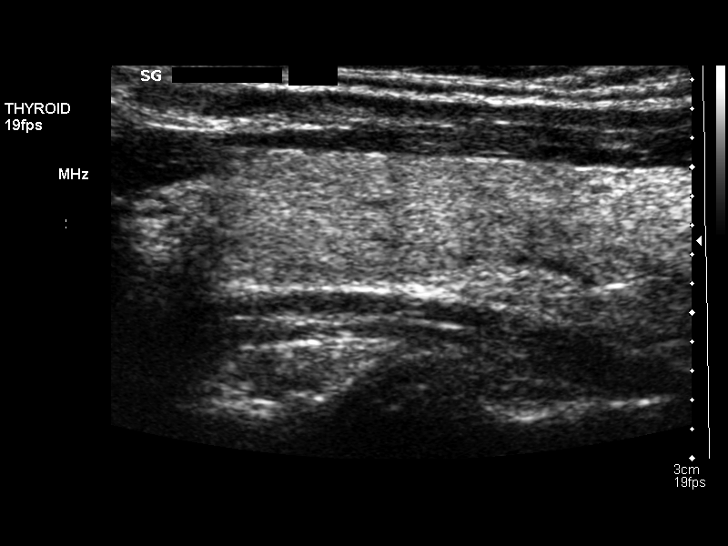
[im 17/49]
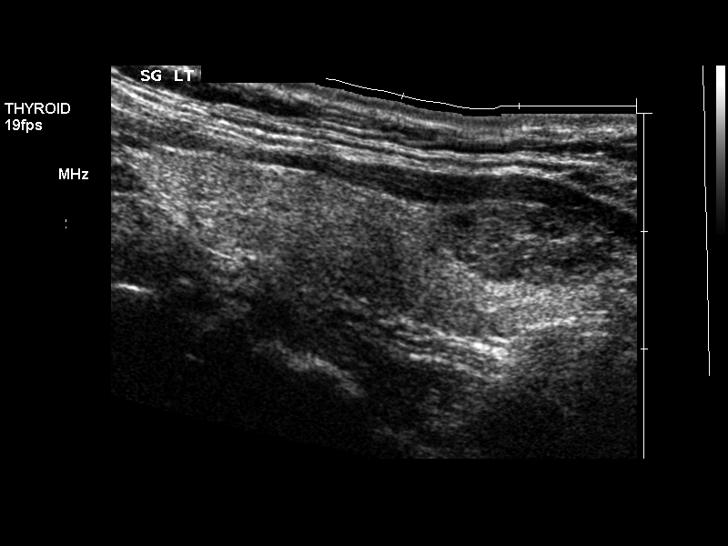
[im 19/49]
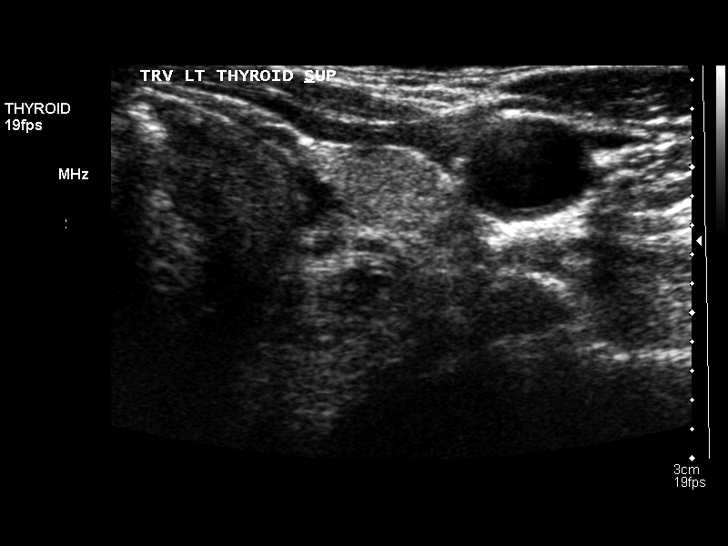
[im 23/49]
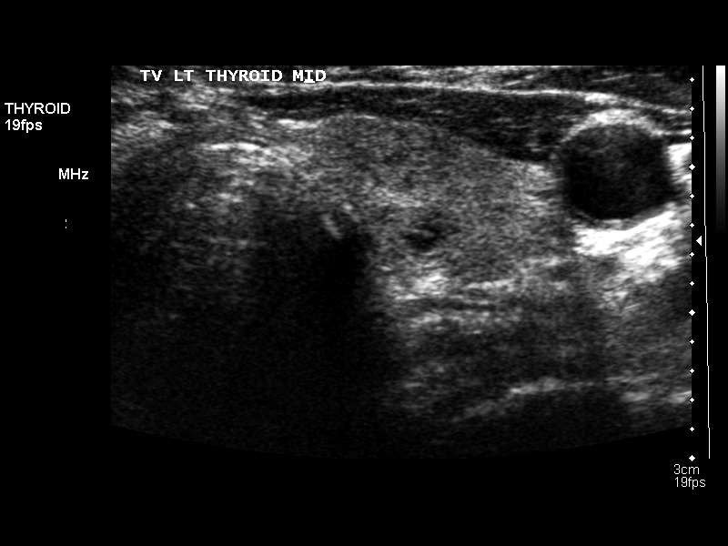
[im 27/49]
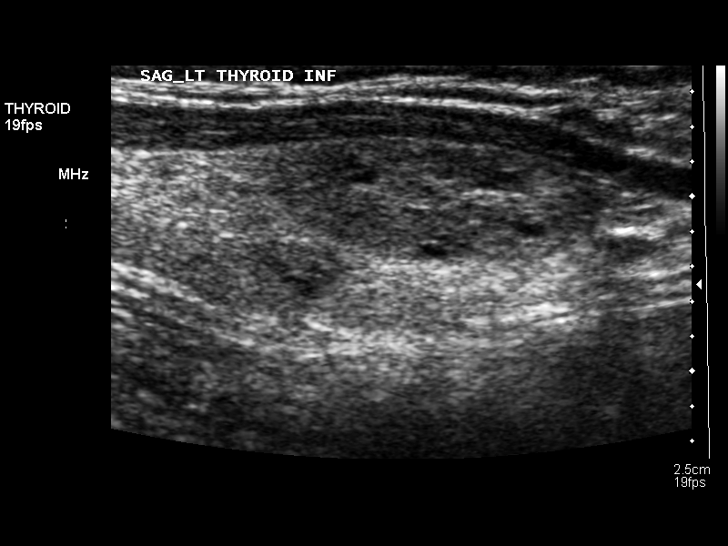
[im 31/49]
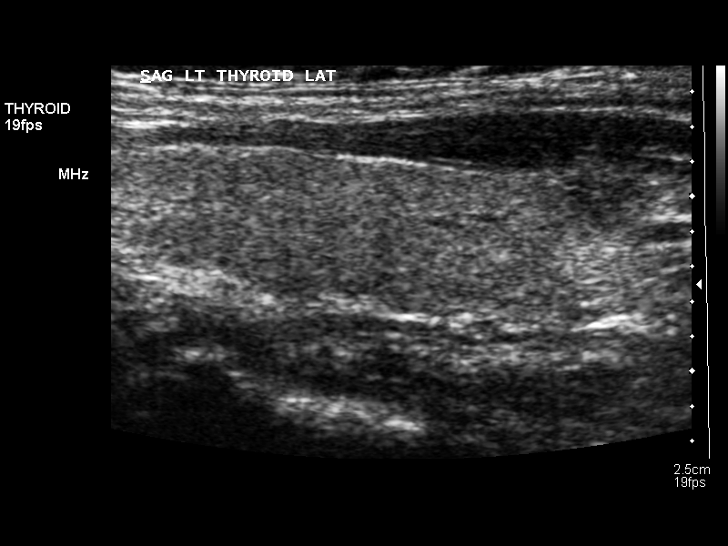
[im 33/49]
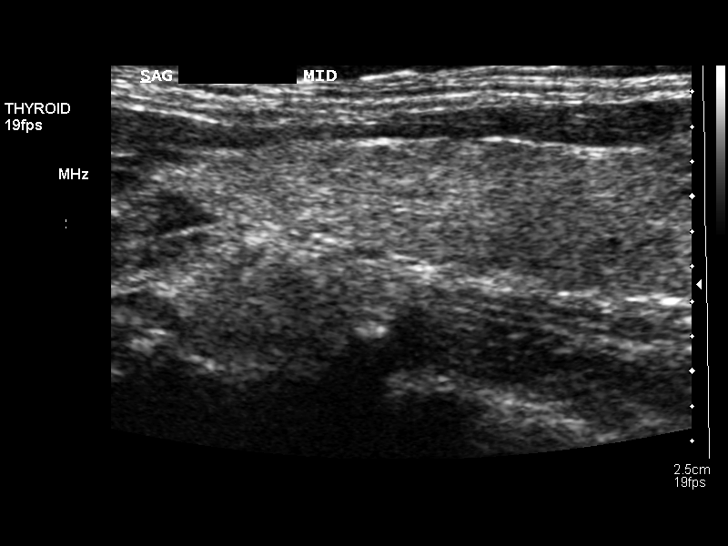
[im 37/49]
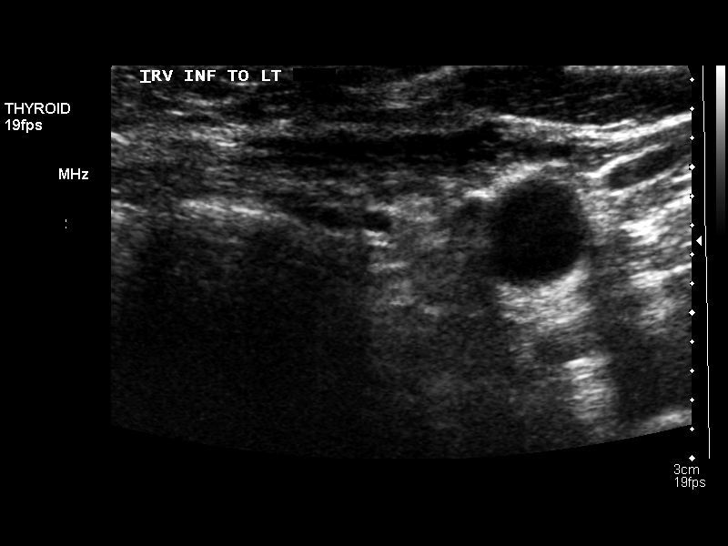
[im 41/49]
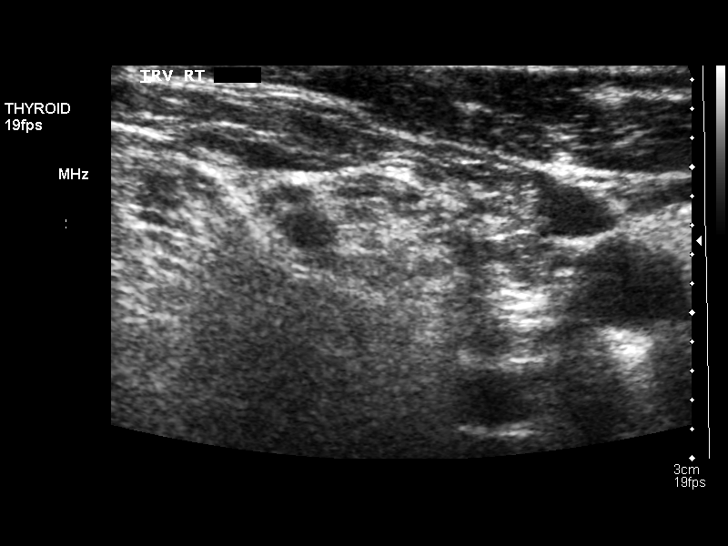
[im 45/49]
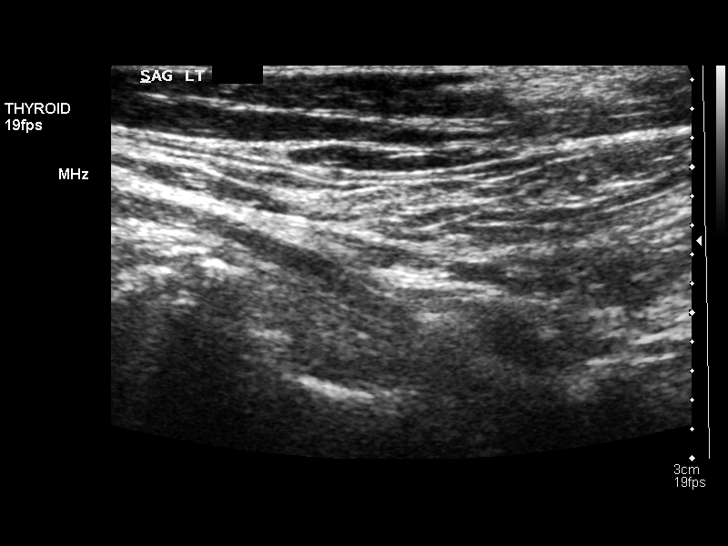
[im 49/49]
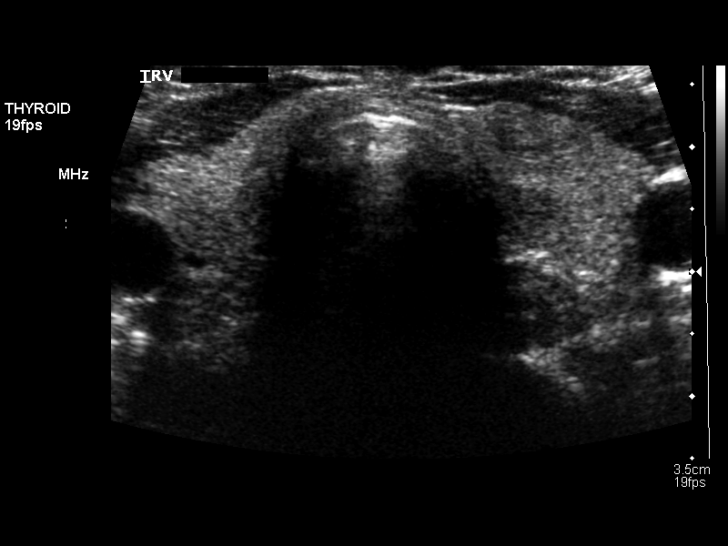

[14 of 25 positions shown; findings below may reference images not displayed]

FINDINGS: Right thyroid lobe

Measurements: 42 x 11 x 13 mm. No nodules visualized.

Left thyroid lobe

Measurements: 44 x 12 x 18 mm.

5.2 mm solid, mid lobe

6 x 16 x 20 mm solid, inferior lobe (previously 7 x 15 x 21)

Isthmus

Thickness: 2.6 mm.  No nodules visualized.

Lymphadenopathy

None visualized.
IMPRESSION: Stable dominant left nodule. Correlate with previous biopsy results.

## 2014-07-14 ENCOUNTER — Ambulatory Visit (INDEPENDENT_AMBULATORY_CARE_PROVIDER_SITE_OTHER): Payer: Medicare Other | Admitting: *Deleted

## 2014-07-14 DIAGNOSIS — I469 Cardiac arrest, cause unspecified: Secondary | ICD-10-CM

## 2014-07-14 DIAGNOSIS — I4901 Ventricular fibrillation: Secondary | ICD-10-CM

## 2014-07-14 LAB — MDC_IDC_ENUM_SESS_TYPE_REMOTE
Battery Remaining Longevity: 129 mo
HIGH POWER IMPEDANCE MEASURED VALUE: 228 Ohm
HighPow Impedance: 81 Ohm
Lead Channel Impedance Value: 513 Ohm
Lead Channel Pacing Threshold Pulse Width: 0.4 ms
Lead Channel Setting Pacing Amplitude: 2.5 V
Lead Channel Setting Pacing Pulse Width: 0.4 ms
MDC IDC MSMT BATTERY VOLTAGE: 3.03 V
MDC IDC MSMT LEADCHNL RV PACING THRESHOLD AMPLITUDE: 0.875 V
MDC IDC MSMT LEADCHNL RV SENSING INTR AMPL: 14.875 mV
MDC IDC MSMT LEADCHNL RV SENSING INTR AMPL: 14.875 mV
MDC IDC SESS DTM: 20150921143207
MDC IDC SET LEADCHNL RV SENSING SENSITIVITY: 0.3 mV
MDC IDC STAT BRADY RV PERCENT PACED: 0.01 %
Zone Setting Detection Interval: 270 ms
Zone Setting Detection Interval: 360 ms
Zone Setting Detection Interval: 360 ms

## 2014-07-14 NOTE — Progress Notes (Signed)
Remote ICD transmission.   

## 2014-07-28 ENCOUNTER — Encounter: Payer: Self-pay | Admitting: Cardiology

## 2014-07-31 ENCOUNTER — Encounter: Payer: Self-pay | Admitting: Internal Medicine

## 2014-10-02 ENCOUNTER — Encounter (HOSPITAL_COMMUNITY): Payer: Self-pay | Admitting: Internal Medicine

## 2014-10-14 ENCOUNTER — Ambulatory Visit (INDEPENDENT_AMBULATORY_CARE_PROVIDER_SITE_OTHER): Payer: Medicare Other | Admitting: *Deleted

## 2014-10-14 DIAGNOSIS — I469 Cardiac arrest, cause unspecified: Secondary | ICD-10-CM

## 2014-10-14 DIAGNOSIS — I4901 Ventricular fibrillation: Secondary | ICD-10-CM

## 2014-10-14 LAB — MDC_IDC_ENUM_SESS_TYPE_REMOTE
Brady Statistic RV Percent Paced: 0 %
HighPow Impedance: 63 Ohm
Lead Channel Impedance Value: 513 Ohm
Lead Channel Pacing Threshold Pulse Width: 0.4 ms
Lead Channel Sensing Intrinsic Amplitude: 14.6 mV
Lead Channel Setting Pacing Amplitude: 2.5 V
Lead Channel Setting Pacing Pulse Width: 0.4 ms
MDC IDC MSMT BATTERY REMAINING LONGEVITY: 126 mo
MDC IDC MSMT LEADCHNL RV PACING THRESHOLD AMPLITUDE: 1 V
MDC IDC SET LEADCHNL RV SENSING SENSITIVITY: 0.3 mV
MDC IDC SET ZONE DETECTION INTERVAL: 360 ms
Zone Setting Detection Interval: 270 ms
Zone Setting Detection Interval: 360 ms

## 2014-10-14 NOTE — Progress Notes (Signed)
Remote ICD transmission.   

## 2014-10-21 ENCOUNTER — Encounter: Payer: Self-pay | Admitting: Cardiology

## 2014-11-06 DIAGNOSIS — E039 Hypothyroidism, unspecified: Secondary | ICD-10-CM | POA: Diagnosis not present

## 2014-11-06 DIAGNOSIS — F329 Major depressive disorder, single episode, unspecified: Secondary | ICD-10-CM | POA: Diagnosis not present

## 2014-11-12 ENCOUNTER — Emergency Department (HOSPITAL_COMMUNITY): Payer: Medicare Other

## 2014-11-12 ENCOUNTER — Encounter (HOSPITAL_COMMUNITY)
Admission: EM | Disposition: A | Discharge status: Skilled Nursing Facility | Payer: Self-pay | Source: Home / Self Care | Attending: Internal Medicine

## 2014-11-12 ENCOUNTER — Encounter (HOSPITAL_COMMUNITY): Payer: Self-pay

## 2014-11-12 ENCOUNTER — Inpatient Hospital Stay (HOSPITAL_COMMUNITY): Payer: Medicare Other

## 2014-11-12 ENCOUNTER — Inpatient Hospital Stay (HOSPITAL_COMMUNITY)
Admission: EM | Admit: 2014-11-12 | Discharge: 2014-11-17 | DRG: 470 | Disposition: A | Discharge status: Skilled Nursing Facility | Payer: Medicare Other | Attending: Internal Medicine | Admitting: Internal Medicine

## 2014-11-12 ENCOUNTER — Inpatient Hospital Stay (HOSPITAL_COMMUNITY): Payer: Medicare Other | Admitting: Anesthesiology

## 2014-11-12 DIAGNOSIS — Z87891 Personal history of nicotine dependence: Secondary | ICD-10-CM | POA: Diagnosis not present

## 2014-11-12 DIAGNOSIS — W19XXXA Unspecified fall, initial encounter: Secondary | ICD-10-CM | POA: Diagnosis present

## 2014-11-12 DIAGNOSIS — M21252 Flexion deformity, left hip: Secondary | ICD-10-CM | POA: Diagnosis not present

## 2014-11-12 DIAGNOSIS — Z882 Allergy status to sulfonamides status: Secondary | ICD-10-CM | POA: Diagnosis not present

## 2014-11-12 DIAGNOSIS — Y92009 Unspecified place in unspecified non-institutional (private) residence as the place of occurrence of the external cause: Secondary | ICD-10-CM

## 2014-11-12 DIAGNOSIS — W1809XA Striking against other object with subsequent fall, initial encounter: Secondary | ICD-10-CM | POA: Diagnosis present

## 2014-11-12 DIAGNOSIS — E039 Hypothyroidism, unspecified: Secondary | ICD-10-CM | POA: Diagnosis not present

## 2014-11-12 DIAGNOSIS — Z95810 Presence of automatic (implantable) cardiac defibrillator: Secondary | ICD-10-CM | POA: Diagnosis not present

## 2014-11-12 DIAGNOSIS — S72002A Fracture of unspecified part of neck of left femur, initial encounter for closed fracture: Secondary | ICD-10-CM

## 2014-11-12 DIAGNOSIS — I509 Heart failure, unspecified: Secondary | ICD-10-CM | POA: Diagnosis not present

## 2014-11-12 DIAGNOSIS — F329 Major depressive disorder, single episode, unspecified: Secondary | ICD-10-CM | POA: Diagnosis present

## 2014-11-12 DIAGNOSIS — M810 Age-related osteoporosis without current pathological fracture: Secondary | ICD-10-CM | POA: Diagnosis not present

## 2014-11-12 DIAGNOSIS — M797 Fibromyalgia: Secondary | ICD-10-CM | POA: Diagnosis not present

## 2014-11-12 DIAGNOSIS — M25552 Pain in left hip: Secondary | ICD-10-CM | POA: Diagnosis not present

## 2014-11-12 DIAGNOSIS — R488 Other symbolic dysfunctions: Secondary | ICD-10-CM | POA: Diagnosis not present

## 2014-11-12 DIAGNOSIS — S72009A Fracture of unspecified part of neck of unspecified femur, initial encounter for closed fracture: Secondary | ICD-10-CM | POA: Diagnosis present

## 2014-11-12 DIAGNOSIS — F32A Depression, unspecified: Secondary | ICD-10-CM

## 2014-11-12 DIAGNOSIS — Z471 Aftercare following joint replacement surgery: Secondary | ICD-10-CM | POA: Diagnosis not present

## 2014-11-12 DIAGNOSIS — S72002D Fracture of unspecified part of neck of left femur, subsequent encounter for closed fracture with routine healing: Secondary | ICD-10-CM | POA: Diagnosis not present

## 2014-11-12 DIAGNOSIS — Z8674 Personal history of sudden cardiac arrest: Secondary | ICD-10-CM

## 2014-11-12 DIAGNOSIS — S098XXA Other specified injuries of head, initial encounter: Secondary | ICD-10-CM | POA: Diagnosis not present

## 2014-11-12 DIAGNOSIS — W010XXA Fall on same level from slipping, tripping and stumbling without subsequent striking against object, initial encounter: Secondary | ICD-10-CM | POA: Diagnosis not present

## 2014-11-12 DIAGNOSIS — M6281 Muscle weakness (generalized): Secondary | ICD-10-CM | POA: Diagnosis not present

## 2014-11-12 DIAGNOSIS — Z4789 Encounter for other orthopedic aftercare: Secondary | ICD-10-CM | POA: Diagnosis not present

## 2014-11-12 DIAGNOSIS — I5042 Chronic combined systolic (congestive) and diastolic (congestive) heart failure: Secondary | ICD-10-CM | POA: Diagnosis present

## 2014-11-12 DIAGNOSIS — M1990 Unspecified osteoarthritis, unspecified site: Secondary | ICD-10-CM | POA: Diagnosis not present

## 2014-11-12 DIAGNOSIS — I5032 Chronic diastolic (congestive) heart failure: Secondary | ICD-10-CM | POA: Diagnosis not present

## 2014-11-12 DIAGNOSIS — R262 Difficulty in walking, not elsewhere classified: Secondary | ICD-10-CM | POA: Diagnosis not present

## 2014-11-12 DIAGNOSIS — D696 Thrombocytopenia, unspecified: Secondary | ICD-10-CM | POA: Diagnosis present

## 2014-11-12 DIAGNOSIS — S72012A Unspecified intracapsular fracture of left femur, initial encounter for closed fracture: Secondary | ICD-10-CM | POA: Diagnosis not present

## 2014-11-12 DIAGNOSIS — R51 Headache: Secondary | ICD-10-CM | POA: Diagnosis not present

## 2014-11-12 DIAGNOSIS — S299XXA Unspecified injury of thorax, initial encounter: Secondary | ICD-10-CM | POA: Diagnosis not present

## 2014-11-12 DIAGNOSIS — Z96642 Presence of left artificial hip joint: Secondary | ICD-10-CM | POA: Diagnosis not present

## 2014-11-12 DIAGNOSIS — I4901 Ventricular fibrillation: Secondary | ICD-10-CM | POA: Diagnosis not present

## 2014-11-12 DIAGNOSIS — W1830XD Fall on same level, unspecified, subsequent encounter: Secondary | ICD-10-CM | POA: Diagnosis not present

## 2014-11-12 HISTORY — PX: HIP ARTHROPLASTY: SHX981

## 2014-11-12 LAB — BASIC METABOLIC PANEL
Anion gap: 8 (ref 5–15)
BUN: 14 mg/dL (ref 6–23)
CHLORIDE: 102 meq/L (ref 96–112)
CO2: 28 mmol/L (ref 19–32)
Calcium: 9.4 mg/dL (ref 8.4–10.5)
Creatinine, Ser: 0.75 mg/dL (ref 0.50–1.10)
GFR calc Af Amer: >90 mL/min (ref >90)
GFR calc non Af Amer: 83 mL/min — ABNORMAL LOW (ref >90)
Glucose, Bld: 108 mg/dL — ABNORMAL HIGH (ref 70–99)
Potassium: 3.7 mmol/L (ref 3.5–5.1)
Sodium: 138 mmol/L (ref 135–145)

## 2014-11-12 LAB — CBC WITH DIFFERENTIAL/PLATELET
BASOS ABS: 0 10*3/uL (ref 0.0–0.1)
BASOS PCT: 0 % (ref 0–1)
EOS ABS: 0 10*3/uL (ref 0.0–0.7)
Eosinophils Relative: 0 % (ref 0–5)
HCT: 42 % (ref 36.0–46.0)
Hemoglobin: 14.1 g/dL (ref 12.0–15.0)
Lymphocytes Relative: 6 % — ABNORMAL LOW (ref 12–46)
Lymphs Abs: 0.6 10*3/uL — ABNORMAL LOW (ref 0.7–4.0)
MCH: 31.1 pg (ref 26.0–34.0)
MCHC: 33.6 g/dL (ref 30.0–36.0)
MCV: 92.7 fL (ref 78.0–100.0)
MONOS PCT: 10 % (ref 3–12)
Monocytes Absolute: 1 10*3/uL (ref 0.1–1.0)
NEUTROS ABS: 8.4 10*3/uL — AB (ref 1.7–7.7)
NEUTROS PCT: 84 % — AB (ref 43–77)
PLATELETS: 179 10*3/uL (ref 150–400)
RBC: 4.53 MIL/uL (ref 3.87–5.11)
RDW: 12.4 % (ref 11.5–15.5)
WBC: 10 10*3/uL (ref 4.0–10.5)

## 2014-11-12 LAB — PROTIME-INR
INR: 1.03 (ref 0.00–1.49)
PROTHROMBIN TIME: 13.7 s (ref 11.6–15.2)

## 2014-11-12 LAB — SURGICAL PCR SCREEN
MRSA, PCR: NEGATIVE
STAPHYLOCOCCUS AUREUS: NEGATIVE

## 2014-11-12 LAB — TYPE AND SCREEN
ABO/RH(D): O NEG
Antibody Screen: NEGATIVE

## 2014-11-12 LAB — ABO/RH: ABO/RH(D): O NEG

## 2014-11-12 SURGERY — HEMIARTHROPLASTY, HIP, DIRECT ANTERIOR APPROACH, FOR FRACTURE
Anesthesia: General | Site: Hip | Laterality: Left

## 2014-11-12 MED ORDER — EPHEDRINE SULFATE 50 MG/ML IJ SOLN
INTRAMUSCULAR | Status: AC
  Filled 2014-11-12: qty 1

## 2014-11-12 MED ORDER — SERTRALINE HCL 100 MG PO TABS
100.0000 mg | ORAL_TABLET | Freq: Every day | ORAL | Status: DC
  Administered 2014-11-13 – 2014-11-17 (×6): 100 mg via ORAL
  Filled 2014-11-12 (×7): qty 1

## 2014-11-12 MED ORDER — HYDROCODONE-ACETAMINOPHEN 5-325 MG PO TABS: 1.0000 | ORAL_TABLET | Freq: Four times a day (QID) | ORAL | Status: DC | PRN

## 2014-11-12 MED ORDER — FENTANYL CITRATE 0.05 MG/ML IJ SOLN
INTRAMUSCULAR | Status: DC | PRN
  Administered 2014-11-12 (×4): 50 ug via INTRAVENOUS

## 2014-11-12 MED ORDER — SUCCINYLCHOLINE CHLORIDE 20 MG/ML IJ SOLN
INTRAMUSCULAR | Status: AC
  Filled 2014-11-12: qty 2

## 2014-11-12 MED ORDER — ONDANSETRON HCL 4 MG/2ML IJ SOLN
4.0000 mg | Freq: Once | INTRAMUSCULAR | Status: AC
Start: 2014-11-12 — End: 2014-11-12
  Administered 2014-11-12: 4 mg via INTRAVENOUS
  Filled 2014-11-12: qty 2

## 2014-11-12 MED ORDER — FENTANYL CITRATE 0.05 MG/ML IJ SOLN
INTRAMUSCULAR | Status: AC
  Filled 2014-11-12: qty 5

## 2014-11-12 MED ORDER — MORPHINE SULFATE 2 MG/ML IJ SOLN: 0.5000 mg | INTRAMUSCULAR | Status: DC | PRN

## 2014-11-12 MED ORDER — FENTANYL CITRATE 0.05 MG/ML IJ SOLN
25.0000 ug | INTRAMUSCULAR | Status: DC | PRN
  Administered 2014-11-13: 25 ug via INTRAVENOUS

## 2014-11-12 MED ORDER — SODIUM CHLORIDE 0.9 % IJ SOLN
INTRAMUSCULAR | Status: AC
  Filled 2014-11-12: qty 10

## 2014-11-12 MED ORDER — ONDANSETRON HCL 4 MG/2ML IJ SOLN
INTRAMUSCULAR | Status: AC
  Filled 2014-11-12: qty 8

## 2014-11-12 MED ORDER — ALBUMIN HUMAN 5 % IV SOLN
INTRAVENOUS | Status: DC | PRN
  Administered 2014-11-12: 23:00:00 via INTRAVENOUS

## 2014-11-12 MED ORDER — LACTATED RINGERS IV SOLN
INTRAVENOUS | Status: DC | PRN
  Administered 2014-11-12: 22:00:00 via INTRAVENOUS

## 2014-11-12 MED ORDER — ETOMIDATE 2 MG/ML IV SOLN
INTRAVENOUS | Status: AC
  Filled 2014-11-12: qty 10

## 2014-11-12 MED ORDER — HYDROMORPHONE HCL 1 MG/ML IJ SOLN
0.5000 mg | INTRAMUSCULAR | Status: AC | PRN
Start: 2014-11-12 — End: 2014-11-12
  Administered 2014-11-12 (×2): 0.5 mg via INTRAVENOUS
  Filled 2014-11-12 (×2): qty 1

## 2014-11-12 MED ORDER — ONDANSETRON HCL 4 MG/2ML IJ SOLN
INTRAMUSCULAR | Status: DC | PRN
  Administered 2014-11-12: 4 mg via INTRAVENOUS

## 2014-11-12 MED ORDER — EPHEDRINE SULFATE 50 MG/ML IJ SOLN
INTRAMUSCULAR | Status: DC | PRN
Start: 2014-11-12 — End: 2014-11-12
  Administered 2014-11-12: 10 mg via INTRAVENOUS

## 2014-11-12 MED ORDER — LEVOTHYROXINE SODIUM 112 MCG PO TABS
112.0000 ug | ORAL_TABLET | Freq: Every day | ORAL | Status: DC
Start: 2014-11-13 — End: 2014-11-17
  Administered 2014-11-13 – 2014-11-17 (×5): 112 ug via ORAL
  Filled 2014-11-12 (×6): qty 1

## 2014-11-12 MED ORDER — ETOMIDATE 2 MG/ML IV SOLN
INTRAVENOUS | Status: DC | PRN
  Administered 2014-11-12: 12 mg via INTRAVENOUS

## 2014-11-12 MED ORDER — MIDAZOLAM HCL 2 MG/2ML IJ SOLN
INTRAMUSCULAR | Status: AC
  Filled 2014-11-12: qty 2

## 2014-11-12 MED ORDER — ROCURONIUM BROMIDE 50 MG/5ML IV SOLN
INTRAVENOUS | Status: AC
  Filled 2014-11-12: qty 1

## 2014-11-12 MED ORDER — CEFAZOLIN SODIUM-DEXTROSE 2-3 GM-% IV SOLR
2.0000 g | INTRAVENOUS | Status: AC
  Administered 2014-11-12 – 2014-11-13 (×2): 2 g via INTRAVENOUS
  Filled 2014-11-12: qty 50

## 2014-11-12 MED ORDER — SODIUM CHLORIDE 0.9 % IV SOLN: INTRAVENOUS | Status: DC

## 2014-11-12 MED ORDER — LIDOCAINE HCL (CARDIAC) 20 MG/ML IV SOLN
INTRAVENOUS | Status: AC
  Filled 2014-11-12: qty 15

## 2014-11-12 MED ORDER — CHLORHEXIDINE GLUCONATE 4 % EX LIQD
60.0000 mL | Freq: Once | CUTANEOUS | Status: DC
Start: 2014-11-12 — End: 2014-11-13
  Filled 2014-11-12 (×2): qty 60

## 2014-11-12 MED ORDER — SODIUM CHLORIDE 0.9 % IR SOLN
Status: DC | PRN
  Administered 2014-11-12: 1000 mL

## 2014-11-12 MED ORDER — GLYCOPYRROLATE 0.2 MG/ML IJ SOLN
INTRAMUSCULAR | Status: AC
  Filled 2014-11-12: qty 2

## 2014-11-12 MED ORDER — NEOSTIGMINE METHYLSULFATE 10 MG/10ML IV SOLN
INTRAVENOUS | Status: AC
  Filled 2014-11-12: qty 1

## 2014-11-12 MED ORDER — SUCCINYLCHOLINE CHLORIDE 20 MG/ML IJ SOLN
INTRAMUSCULAR | Status: DC | PRN
  Administered 2014-11-12: 100 mg via INTRAVENOUS

## 2014-11-12 MED ORDER — ENOXAPARIN SODIUM 40 MG/0.4ML ~~LOC~~ SOLN
40.0000 mg | SUBCUTANEOUS | Status: DC
  Filled 2014-11-12: qty 0.4

## 2014-11-12 SURGICAL SUPPLY — 50 items
BLADE SAW SAG 73X25 THK (BLADE) ×2
BLADE SAW SGTL 73X25 THK (BLADE) ×1 IMPLANT
BRUSH FEMORAL CANAL (MISCELLANEOUS) IMPLANT
CAPT HIP HEMI 2 ×3 IMPLANT
CLOSURE WOUND 1/2 X4 (GAUZE/BANDAGES/DRESSINGS)
COVER SURGICAL LIGHT HANDLE (MISCELLANEOUS) ×3 IMPLANT
DRAPE IMP U-DRAPE 54X76 (DRAPES) ×3 IMPLANT
DRAPE INCISE IOBAN 66X45 STRL (DRAPES) ×6 IMPLANT
DRAPE ORTHO SPLIT 77X108 STRL (DRAPES) ×4
DRAPE SURG ORHT 6 SPLT 77X108 (DRAPES) ×2 IMPLANT
DRAPE U-SHAPE 47X51 STRL (DRAPES) ×3 IMPLANT
DRSG MEPILEX BORDER 4X8 (GAUZE/BANDAGES/DRESSINGS) ×3 IMPLANT
DURAPREP 26ML APPLICATOR (WOUND CARE) ×3 IMPLANT
ELECT BLADE 4.0 EZ CLEAN MEGAD (MISCELLANEOUS) ×3
ELECT REM PT RETURN 9FT ADLT (ELECTROSURGICAL) ×3
ELECTRODE BLDE 4.0 EZ CLN MEGD (MISCELLANEOUS) ×1 IMPLANT
ELECTRODE REM PT RTRN 9FT ADLT (ELECTROSURGICAL) ×1 IMPLANT
GLOVE BIOGEL PI ORTHO PRO 7.5 (GLOVE) ×2
GLOVE BIOGEL PI ORTHO PRO SZ8 (GLOVE) ×2
GLOVE ORTHO TXT STRL SZ7.5 (GLOVE) ×6 IMPLANT
GLOVE PI ORTHO PRO STRL 7.5 (GLOVE) ×1 IMPLANT
GLOVE PI ORTHO PRO STRL SZ8 (GLOVE) ×1 IMPLANT
GLOVE SURG ORTHO 8.5 STRL (GLOVE) ×6 IMPLANT
GOWN STRL REUS W/ TWL XL LVL3 (GOWN DISPOSABLE) ×3 IMPLANT
GOWN STRL REUS W/TWL XL LVL3 (GOWN DISPOSABLE) ×6
HANDPIECE INTERPULSE COAX TIP (DISPOSABLE)
IMMOBILIZER KNEE 22 UNIV (SOFTGOODS) ×3 IMPLANT
KIT BASIN OR (CUSTOM PROCEDURE TRAY) ×3 IMPLANT
KIT ROOM TURNOVER OR (KITS) ×3 IMPLANT
MANIFOLD NEPTUNE II (INSTRUMENTS) ×3 IMPLANT
NS IRRIG 1000ML POUR BTL (IV SOLUTION) ×3 IMPLANT
PACK TOTAL JOINT (CUSTOM PROCEDURE TRAY) ×3 IMPLANT
PACK UNIVERSAL I (CUSTOM PROCEDURE TRAY) IMPLANT
PAD ARMBOARD 7.5X6 YLW CONV (MISCELLANEOUS) ×6 IMPLANT
SET HNDPC FAN SPRY TIP SCT (DISPOSABLE) IMPLANT
STAPLER VISISTAT 35W (STAPLE) ×3 IMPLANT
STRIP CLOSURE SKIN 1/2X4 (GAUZE/BANDAGES/DRESSINGS) IMPLANT
SUT ETHIBOND NAB CT1 #1 30IN (SUTURE) ×9 IMPLANT
SUT MNCRL AB 3-0 PS2 18 (SUTURE) IMPLANT
SUT VIC AB 0 CT1 27 (SUTURE) ×4
SUT VIC AB 0 CT1 27XBRD ANBCTR (SUTURE) ×2 IMPLANT
SUT VIC AB 1 CT1 27 (SUTURE)
SUT VIC AB 1 CT1 27XBRD ANBCTR (SUTURE) IMPLANT
SUT VIC AB 2-0 CT1 27 (SUTURE) ×2
SUT VIC AB 2-0 CT1 TAPERPNT 27 (SUTURE) ×1 IMPLANT
TOWEL OR 17X24 6PK STRL BLUE (TOWEL DISPOSABLE) ×3 IMPLANT
TOWEL OR 17X26 10 PK STRL BLUE (TOWEL DISPOSABLE) ×3 IMPLANT
TOWER CARTRIDGE SMART MIX (DISPOSABLE) IMPLANT
TRAY FOLEY CATH 16FRSI W/METER (SET/KITS/TRAYS/PACK) ×3 IMPLANT
WATER STERILE IRR 1000ML POUR (IV SOLUTION) IMPLANT

## 2014-11-12 NOTE — Progress Notes (Addendum)
Received call from anesthesiologist, pt transferred to [facility] from [facility]. Not clear why the transfer made, ? availability of tele bed at [facility]. Will notify floor manager so that [facility] hospitalist can take over the care.   Faye Ramsay, MD  [facility] Pager [phone] Cell [phone]  If 7PM-7AM, please contact night-coverage [email or web address] Password [email or web address]

## 2014-11-12 NOTE — ED Notes (Signed)
Verbal consent given by pt to transfer to [facility]

## 2014-11-12 NOTE — ED Notes (Signed)
Patient transported to X-ray 

## 2014-11-12 NOTE — ED Notes (Signed)
Bed: WA06 Expected date:  Expected time:  Means of arrival:  Comments: EMS-fall

## 2014-11-12 NOTE — ED Notes (Addendum)
Called [facility] to given report-unable to receive report at this time.  Will call back

## 2014-11-12 NOTE — Brief Op Note (Signed)
11/12/2014  11:39 PM  PATIENT:  [name]  74 y.o. female  PRE-OPERATIVE DIAGNOSIS:  Left Hip Fracture, displaced femoral neck fracture  POST-OPERATIVE DIAGNOSIS:  Left Hip Fracture, displaced femoral neck fracture  PROCEDURE:  Procedure(s): Left Hip Hemiarthroplasty (Left) DePuy Tri Loc  SURGEON:  Surgeon(s) and Role:    * Augustin Schooling, MD - Primary  PHYSICIAN ASSISTANT:   ASSISTANTS: Ventura Bruns, PA-C   ANESTHESIA:   general  EBL:  Total I/O In: 500 [I.V.:250; IV Piggyback:250] Out: 125 [Urine:125]  BLOOD ADMINISTERED:none  DRAINS: none   LOCAL MEDICATIONS USED:  NONE  SPECIMEN:  No Specimen  DISPOSITION OF SPECIMEN:  N/A  COUNTS:  YES  TOURNIQUET:  * No tourniquets in log *  DICTATION: .Other Dictation: Dictation Number [id number]  PLAN OF CARE: Admit to inpatient   PATIENT DISPOSITION:  PACU - hemodynamically stable.   Delay start of Pharmacological VTE agent (>24hrs) due to surgical blood loss or risk of bleeding: no

## 2014-11-12 NOTE — H&P (Signed)
[facility] History and Physical  [name] OXB:[id number] DOB: Sep 19, 1941 DOA: 11/12/2014  Referring physician: ED physician PCP: Horatio Pel, MD   Chief Complaint: fall   HPI:  Pt is 74 yo female with history of hypothyroidism and CHF, diastolic and systolic CHF, s/p ICD placement, presented to [facility] ED after an episode of fall at home. Pt denies similar events in the past, no chest pain prior to the event, no shortness of breath, no abd or urinary concerns. After the fall, pt reports intermittent left hip pain, throbbing and sharp, 10/10 in severity when present, worse with movement and with no specific alleviating factors.   In ED, pt is hemodynamically stable, VSS< blood work stable, left hip fracture confirmed on XRAY. Ortho consulted and [facility] asked to admit.   Assessment and Plan: Active Problems: Left hip fracture - cleared for surgery from IM standpoint - pt is clinically stable for surgery - keep NPO - provide analgesia post op - also plan on PT eval in am Combined CHF, diastolic and systolic, with ICD in place - no cardiac concerns at this time - no acute findings on 12 lead EKG - monitor on tele post op  Hypothyroidism - continue synthroid    DVT prophylaxis: post op Lovenox SQ  Radiological Exams on Admission: Dg Chest 1 View  11/12/2014   CLINICAL DATA:  Tripped and fell.  Left hip pain.  EXAM: CHEST - 1 VIEW  COMPARISON:  01/19/2013  FINDINGS: The cardiac silhouette, mediastinal and hilar contours are within normal limits and stable. The pacer wires/ AICD are stable. No acute pulmonary findings. No pleural effusion. The bony thorax is intact.  IMPRESSION: No acute cardiopulmonary findings.   Electronically Signed   By: Kalman Jewels M.D.   On: 11/12/2014 16:15   Dg Hip Unilat With Pelvis 2-3 Views Left  11/12/2014   CLINICAL DATA:  Tripped in driveway yesterday with persistent left hip pain, initial encounter  EXAM: DG HIP W/ PELVIS 2-3V*L*   COMPARISON:  None.  FINDINGS: There are changes consistent with a subcapital femoral neck fracture with some impaction and angulation at the fracture site. No other bony abnormality is seen.  IMPRESSION: Subcapital left femoral neck fracture.   Electronically Signed   By: Inez Catalina M.D.   On: 11/12/2014 16:11     Code Status: Full Family Communication: Pt at bedside Disposition Plan: Admit for further evaluation     Review of Systems:  Constitutional: Negative for fever, chills and malaise/fatigue. Negative for diaphoresis.  HENT: Negative for hearing loss, ear pain, nosebleeds, congestion, sore throat, neck pain, tinnitus and ear discharge.   Eyes: Negative for blurred vision, double vision, photophobia, pain, discharge and redness.  Respiratory: Negative for cough, hemoptysis, sputum production, shortness of breath, wheezing and stridor.   Cardiovascular: Negative for chest pain, palpitations, orthopnea, claudication and leg swelling.  Gastrointestinal: Negative for nausea, vomiting and abdominal pain. Negative for heartburn, constipation, blood in stool and melena.  Genitourinary: Negative for dysuria, urgency, frequency, hematuria and flank pain.  Musculoskeletal: Negative for myalgias, back pain.  Skin: Negative for itching and rash.  Neurological: Negative for dizziness and weakness.  Endo/Heme/Allergies: Negative for environmental allergies and polydipsia. Does not bruise/bleed easily.  Psychiatric/Behavioral: Negative for suicidal ideas. The patient is not nervous/anxious.      Past Medical History  Diagnosis Date  . Arthritis   . Arthritis   . Congestive heart failure   . Cardiac defibrillator in place 2005  . Depression   .  Osteoporosis   . Thyroid disease   . Osteoporosis   . Thyroid disease   . Fibromyalgia   . Gallbladder disease     Past Surgical History  Procedure Laterality Date  . Colonoscopy    . Polypectomy    . Colonoscopy  2010    Adenomatous with  high grade dysplasia  . Cardiac catheterization  2005  . Cardiac defibrillator placement  12-23-2003    MDT single chamber ICD implanted by Dr Caryl Comes [phone] lead)  . Implantable cardioverter defibrillator generator change  01-18-2013    MDT ICD gen change with insertion of new RV lead, capping of 6949 lead by Dr Lovena Le  . Implantable cardioverter defibrillator generator change N/A 01/18/2013    Procedure: IMPLANTABLE CARDIOVERTER DEFIBRILLATOR GENERATOR CHANGE;  Surgeon: Evans Lance, MD;  Location: [facility] CATH LAB;  Service: Cardiovascular;  Laterality: N/A;  . Lead revision N/A 01/18/2013    Procedure: LEAD REVISION;  Surgeon: Evans Lance, MD;  Location: [facility] CATH LAB;  Service: Cardiovascular;  Laterality: N/A;    Social History:  reports that she quit smoking about 7 years ago. She has never used smokeless tobacco. She reports that she does not drink alcohol or use illicit drugs.  Allergies  Allergen Reactions  . Sulfonamide Derivatives Other (See Comments)    Blisters in mouth    Family History  Problem Relation Age of Onset  . Breast cancer Sister     Prior to Admission medications   Medication Sig Start Date End Date Taking? Authorizing Provider  fish oil-omega-3 fatty acids 1000 MG capsule Take 1 g by mouth daily.   Yes Historical Provider, MD  gabapentin (NEURONTIN) 300 MG capsule Take 300 mg by mouth 2 (two) times daily.   Yes Historical Provider, MD  levothyroxine (SYNTHROID, LEVOTHROID) 112 MCG tablet Take 112 mcg by mouth daily before breakfast.   Yes Historical Provider, MD  Multiple Vitamins-Minerals (MULTIVITAMIN WITH MINERALS) tablet Take 1 tablet by mouth daily.   Yes Historical Provider, MD  sertraline (ZOLOFT) 100 MG tablet Take 100 mg by mouth daily.   Yes Historical Provider, MD    Physical Exam: Filed Vitals:   11/12/14 1437  BP: 156/56  Pulse: 92  Temp: 98.4 F (36.9 C)  TempSrc: Oral  Resp: 18  SpO2: 90%    Physical Exam  Constitutional: Appears  well-developed and well-nourished. No distress.  HENT: Normocephalic. External right and left ear normal. Oropharynx is clear and moist.  Eyes: Conjunctivae and EOM are normal. PERRLA, no scleral icterus.  Neck: Normal ROM. Neck supple. No JVD. No tracheal deviation. No thyromegaly.  CVS: RRR, S1/S2 +, no murmurs, no gallops, no carotid bruit.  Pulmonary: Effort and breath sounds normal, no stridor, rhonchi, wheezes, rales.  Abdominal: Soft. BS +,  no distension, tenderness, rebound or guarding.  Musculoskeletal: Normal range of motion. TTP in left hip area  Lymphadenopathy: No lymphadenopathy noted, cervical, inguinal. Neuro: Alert. Normal reflexes, muscle tone coordination. No cranial nerve deficit. Skin: Skin is warm and dry. No rash noted. Not diaphoretic. No erythema. No pallor.  Psychiatric: Normal mood and affect. Behavior, judgment, thought content normal.   Labs on Admission:  Basic Metabolic Panel:  Recent Labs Lab 11/12/14 1533  NA 138  K 3.7  CL 102  CO2 28  GLUCOSE 108*  BUN 14  CREATININE 0.75  CALCIUM 9.4   CBC:  Recent Labs Lab 11/12/14 1533  WBC 10.0  NEUTROABS 8.4*  HGB 14.1  HCT 42.0  MCV  92.7  PLT 179   CBG: No results for input(s): GLUCAP in the last 168 hours.  EKG: Normal sinus rhythm, no ST/T wave changes  Faye Ramsay, MD  [facility] Pager [phone]  If 7PM-7AM, please contact night-coverage [email or web address] Password [email or web address] 11/12/2014, 5:05 PM

## 2014-11-12 NOTE — ED Notes (Signed)
Family at bedside. 

## 2014-11-12 NOTE — ED Provider Notes (Signed)
CSN: [id number]     Arrival date & time 11/12/14  1428 History   First MD Initiated Contact with Patient 11/12/14 1508     Chief Complaint  Patient presents with  . Hip Pain     (Consider location/radiation/quality/duration/timing/severity/associated sxs/prior Treatment) Patient is a 74 y.o. female presenting with hip pain. The history is provided by the patient.  Hip Pain This is a new (walking in the house and tripped over a rock last night and fell landing on his hip) problem. The current episode started yesterday. The problem occurs constantly. The problem has not changed since onset.Pertinent negatives include no abdominal pain, no headaches and no shortness of breath. Associated symptoms comments: No head injury or upper body trauma. The symptoms are aggravated by walking, bending and twisting. Nothing relieves the symptoms. She has tried rest for the symptoms. The treatment provided no relief.    Past Medical History  Diagnosis Date  . Arthritis   . Arthritis   . Congestive heart failure   . Cardiac defibrillator in place 2005  . Depression   . Osteoporosis   . Thyroid disease   . Osteoporosis   . Thyroid disease   . Fibromyalgia   . Gallbladder disease    Past Surgical History  Procedure Laterality Date  . Colonoscopy    . Polypectomy    . Colonoscopy  2010    Adenomatous with high grade dysplasia  . Cardiac catheterization  2005  . Cardiac defibrillator placement  12-23-2003    MDT single chamber ICD implanted by Dr Caryl Comes [phone] lead)  . Implantable cardioverter defibrillator generator change  01-18-2013    MDT ICD gen change with insertion of new RV lead, capping of [id number] lead by Dr Lovena Le  . Implantable cardioverter defibrillator generator change N/A 01/18/2013    Procedure: IMPLANTABLE CARDIOVERTER DEFIBRILLATOR GENERATOR CHANGE;  Surgeon: Evans Lance, MD;  Location: [facility] CATH LAB;  Service: Cardiovascular;  Laterality: N/A;  . Lead revision N/A 01/18/2013     Procedure: LEAD REVISION;  Surgeon: Evans Lance, MD;  Location: [facility] CATH LAB;  Service: Cardiovascular;  Laterality: N/A;   Family History  Problem Relation Age of Onset  . Breast cancer Sister    History  Substance Use Topics  . Smoking status: Former Smoker    Quit date: 11/22/2006  . Smokeless tobacco: Never Used  . Alcohol Use: No   OB History    No data available     Review of Systems  Constitutional: Negative for fever and chills.  Respiratory: Negative for shortness of breath.   Gastrointestinal: Negative for abdominal pain.  Musculoskeletal: Negative for neck pain.  Neurological: Negative for syncope and headaches.       No LOC  All other systems reviewed and are negative.     Allergies  Sulfonamide derivatives  Home Medications   Prior to Admission medications   Medication Sig Start Date End Date Taking? Authorizing Provider  fish oil-omega-3 fatty acids 1000 MG capsule Take 1 g by mouth daily.   Yes Historical Provider, MD  gabapentin (NEURONTIN) 300 MG capsule Take 300 mg by mouth 2 (two) times daily.   Yes Historical Provider, MD  levothyroxine (SYNTHROID, LEVOTHROID) 112 MCG tablet Take 112 mcg by mouth daily before breakfast.   Yes Historical Provider, MD  Multiple Vitamins-Minerals (MULTIVITAMIN WITH MINERALS) tablet Take 1 tablet by mouth daily.   Yes Historical Provider, MD  sertraline (ZOLOFT) 100 MG tablet Take 100 mg by mouth daily.  Yes Historical Provider, MD   BP 156/56 mmHg  Pulse 92  Temp(Src) 98.4 F (36.9 C) (Oral)  Resp 18  SpO2 90% Physical Exam  Constitutional: She is oriented to person, place, and time. She appears well-developed and well-nourished. She appears distressed.  HENT:  Head: Normocephalic and atraumatic.  Eyes: EOM are normal. Pupils are equal, round, and reactive to light.  Cardiovascular: Normal rate, regular rhythm, normal heart sounds and intact distal pulses.  Exam reveals no friction rub.   No murmur  heard. Pulmonary/Chest: Effort normal and breath sounds normal. She has no wheezes. She has no rales.  Abdominal: Soft. Bowel sounds are normal. She exhibits no distension. There is no tenderness. There is no rebound and no guarding.  Musculoskeletal:       Left hip: She exhibits decreased range of motion, tenderness and bony tenderness. She exhibits no deformity.  No edema  Neurological: She is alert and oriented to person, place, and time. No cranial nerve deficit.  Skin: Skin is warm and dry. No rash noted.  Psychiatric: She has a normal mood and affect. Her behavior is normal.  Nursing note and vitals reviewed.   ED Course  Procedures (including critical care time) Labs Review Labs Reviewed  BASIC METABOLIC PANEL - Abnormal; Notable for the following:    Glucose, Bld 108 (*)    GFR calc non Af Amer 83 (*)    All other components within normal limits  CBC WITH DIFFERENTIAL - Abnormal; Notable for the following:    Neutrophils Relative % 84 (*)    Neutro Abs 8.4 (*)    Lymphocytes Relative 6 (*)    Lymphs Abs 0.6 (*)    All other components within normal limits  PROTIME-INR  TYPE AND SCREEN  ABO/RH    Imaging Review Dg Chest 1 View  11/12/2014   CLINICAL DATA:  Tripped and fell.  Left hip pain.  EXAM: CHEST - 1 VIEW  COMPARISON:  01/19/2013  FINDINGS: The cardiac silhouette, mediastinal and hilar contours are within normal limits and stable. The pacer wires/ AICD are stable. No acute pulmonary findings. No pleural effusion. The bony thorax is intact.  IMPRESSION: No acute cardiopulmonary findings.   Electronically Signed   By: Kalman Jewels M.D.   On: 11/12/2014 16:15   Dg Hip Unilat With Pelvis 2-3 Views Left  11/12/2014   CLINICAL DATA:  Tripped in driveway yesterday with persistent left hip pain, initial encounter  EXAM: DG HIP W/ PELVIS 2-3V*L*  COMPARISON:  None.  FINDINGS: There are changes consistent with a subcapital femoral neck fracture with some impaction and  angulation at the fracture site. No other bony abnormality is seen.  IMPRESSION: Subcapital left femoral neck fracture.   Electronically Signed   By: Inez Catalina M.D.   On: 11/12/2014 16:11     EKG Interpretation None      MDM   Final diagnoses:  Fall    Pt with mechanical fall last night and unable to walk since with left hip pain.  Concern for hip fracture.  N/V intact.  Hip fracture protocol started  4:45 PM Plain film confirmed a left subcapital femoral neck fracture.  Labs wnl.  Will call for admission and repair. Spoke with Dr. Linda Hedges who can possibly do surgery tonight.  Spoke with Dr. Olen Pel who states pt is clear for surgery.  Blanchie Dessert, MD 11/13/14 [phone]

## 2014-11-12 NOTE — Consult Note (Signed)
Reason for Consult:left hip fracture Referring Physician: Khyla Harrington is an 74 y.o. female.  HPI: 74 yo female with history of a ground level fall today.  Patient does not remember the fall but denies LOC, CP, SOB.  Complains only of left hip and thigh pain.  She was unable to stand after the fall.  Patient initially seen in the [facility] ED where XRAYs demonstrated a displaced femoral neck fracture.  Patient transported to [facility] for telemetry reasons. Patient is an independent Hydrographic surveyor.  Past Medical History  Diagnosis Date  . Arthritis   . Arthritis   . Congestive heart failure   . Cardiac defibrillator in place 2005  . Depression   . Osteoporosis   . Thyroid disease   . Osteoporosis   . Thyroid disease   . Fibromyalgia   . Gallbladder disease     Past Surgical History  Procedure Laterality Date  . Colonoscopy    . Polypectomy    . Colonoscopy  2010    Adenomatous with high grade dysplasia  . Cardiac catheterization  2005  . Cardiac defibrillator placement  12-23-2003    MDT single chamber ICD implanted by Dr Caryl Comes [phone] lead)  . Implantable cardioverter defibrillator generator change  01-18-2013    MDT ICD gen change with insertion of new RV lead, capping of 6949 lead by Dr Lovena Le  . Implantable cardioverter defibrillator generator change N/A 01/18/2013    Procedure: IMPLANTABLE CARDIOVERTER DEFIBRILLATOR GENERATOR CHANGE;  Surgeon: Evans Lance, MD;  Location: [facility] CATH LAB;  Service: Cardiovascular;  Laterality: N/A;  . Lead revision N/A 01/18/2013    Procedure: LEAD REVISION;  Surgeon: Evans Lance, MD;  Location: [facility] CATH LAB;  Service: Cardiovascular;  Laterality: N/A;    Family History  Problem Relation Age of Onset  . Breast cancer Sister     Social History:  reports that she quit smoking about 7 years ago. She has never used smokeless tobacco. She reports that she does not drink alcohol or use illicit drugs.  Allergies:  Allergies   Allergen Reactions  . Sulfonamide Derivatives Other (See Comments)    Blisters in mouth    Medications: I have reviewed the patient's current medications.  Results for orders placed or performed during the hospital encounter of 11/12/14 (from the past 48 hour(s))  ABO/Rh     Status: None   Collection Time: 11/12/14  3:31 PM  Result Value Ref Range   ABO/RH(D) O NEG   Basic metabolic panel     Status: Abnormal   Collection Time: 11/12/14  3:33 PM  Result Value Ref Range   Sodium 138 135 - 145 mmol/L    Comment: Please note change in reference range.   Potassium 3.7 3.5 - 5.1 mmol/L    Comment: Please note change in reference range.   Chloride 102 96 - 112 mEq/L   CO2 28 19 - 32 mmol/L   Glucose, Bld 108 (H) 70 - 99 mg/dL   BUN 14 6 - 23 mg/dL   Creatinine, Ser 0.75 0.50 - 1.10 mg/dL   Calcium 9.4 8.4 - 10.5 mg/dL   GFR calc non Af Amer 83 (L) >90 mL/min   GFR calc Af Amer >90 >90 mL/min    Comment: (NOTE) The eGFR has been calculated using the CKD EPI equation. This calculation has not been validated in all clinical situations. eGFR's persistently <90 mL/min signify possible Chronic Kidney Disease.    Anion gap 8  5 - 15  CBC WITH DIFFERENTIAL     Status: Abnormal   Collection Time: 11/12/14  3:33 PM  Result Value Ref Range   WBC 10.0 4.0 - 10.5 K/uL   RBC 4.53 3.87 - 5.11 MIL/uL   Hemoglobin 14.1 12.0 - 15.0 g/dL   HCT 42.0 36.0 - 46.0 %   MCV 92.7 78.0 - 100.0 fL   MCH 31.1 26.0 - 34.0 pg   MCHC 33.6 30.0 - 36.0 g/dL   RDW 12.4 11.5 - 15.5 %   Platelets 179 150 - 400 K/uL   Neutrophils Relative % 84 (H) 43 - 77 %   Neutro Abs 8.4 (H) 1.7 - 7.7 K/uL   Lymphocytes Relative 6 (L) 12 - 46 %   Lymphs Abs 0.6 (L) 0.7 - 4.0 K/uL   Monocytes Relative 10 3 - 12 %   Monocytes Absolute 1.0 0.1 - 1.0 K/uL   Eosinophils Relative 0 0 - 5 %   Eosinophils Absolute 0.0 0.0 - 0.7 K/uL   Basophils Relative 0 0 - 1 %   Basophils Absolute 0.0 0.0 - 0.1 K/uL  Protime-INR      Status: None   Collection Time: 11/12/14  3:33 PM  Result Value Ref Range   Prothrombin Time 13.7 11.6 - 15.2 seconds   INR 1.03 0.00 - 1.49  Type and screen     Status: None   Collection Time: 11/12/14  3:33 PM  Result Value Ref Range   ABO/RH(D) O NEG    Antibody Screen NEG    Sample Expiration 11/15/2014   Surgical pcr screen     Status: None   Collection Time: 11/12/14  7:01 PM  Result Value Ref Range   MRSA, PCR NEGATIVE NEGATIVE   Staphylococcus aureus NEGATIVE NEGATIVE    Comment:        The Xpert SA Assay (FDA approved for NASAL specimens in patients over 35 years of age), is one component of a comprehensive surveillance program.  Test performance has been validated by [facility] for patients greater than or equal to 90 year old. It is not intended to diagnose infection nor to guide or monitor treatment.     Dg Chest 1 View  11/12/2014   CLINICAL DATA:  Tripped and fell.  Left hip pain.  EXAM: CHEST - 1 VIEW  COMPARISON:  01/19/2013  FINDINGS: The cardiac silhouette, mediastinal and hilar contours are within normal limits and stable. The pacer wires/ AICD are stable. No acute pulmonary findings. No pleural effusion. The bony thorax is intact.  IMPRESSION: No acute cardiopulmonary findings.   Electronically Signed   By: Kalman Jewels M.D.   On: 11/12/2014 16:15   Dg Hip Unilat With Pelvis 2-3 Views Left  11/12/2014   CLINICAL DATA:  Tripped in driveway yesterday with persistent left hip pain, initial encounter  EXAM: DG HIP W/ PELVIS 2-3V*L*  COMPARISON:  None.  FINDINGS: There are changes consistent with a subcapital femoral neck fracture with some impaction and angulation at the fracture site. No other bony abnormality is seen.  IMPRESSION: Subcapital left femoral neck fracture.   Electronically Signed   By: Inez Catalina M.D.   On: 11/12/2014 16:11    ROS Blood pressure 113/58, pulse 99, temperature 99.5 F (37.5 C), temperature source Oral, resp. rate 16, height  '5\' 3"'  (1.6 m), weight 69.627 kg (153 lb 8 oz), SpO2 98 %. Physical Exam  AAO x3 HEENT, normal  Neck non-tender, chest clear, heart regular(paced) ,  abdomen soft, right LE with normal pain free ROM, NVI,  Left LE shortened and externally rotated, NVI,  Bilateral UEs with normal pain free ROM, NVI  Assessment/Plan: Displaced femoral neck fracture on the left.  Plan left hip hemiarthroplasty.  [name],STEVEN R 11/12/2014, 9:54 PM

## 2014-11-12 NOTE — Anesthesia Preprocedure Evaluation (Addendum)
Anesthesia Evaluation  Patient identified by MRN, date of birth, ID band Patient awake  General Assessment Comment:Discussed case with Dr. Veverly Fells who examined patient. Cervical cleared. Discussed case with [facility] hospitalist who consulted [phone] pager) who stated patient cleared as well. CE  Reviewed: Allergy & Precautions, NPO status , Patient's Chart, lab work & pertinent test results  Airway Mallampati: II  TM Distance: >3 FB Neck ROM: Full    Dental   Pulmonary former smoker,  breath sounds clear to auscultation        Cardiovascular +CHF + Cardiac Defibrillator Rhythm:Regular Rate:Normal     Neuro/Psych    GI/Hepatic negative GI ROS, Neg liver ROS,   Endo/Other  negative endocrine ROS  Renal/GU negative Renal ROS     Musculoskeletal   Abdominal   Peds  Hematology   Anesthesia Other Findings   Reproductive/Obstetrics                           Anesthesia Physical Anesthesia Plan  ASA: III  Anesthesia Plan: General   Post-op Pain Management:    Induction: Intravenous  Airway Management Planned: Oral ETT  Additional Equipment:   Intra-op Plan:   Post-operative Plan: Possible Post-op intubation/ventilation  Informed Consent: I have reviewed the patients History and Physical, chart, labs and discussed the procedure including the risks, benefits and alternatives for the proposed anesthesia with the patient or authorized representative who has indicated his/her understanding and acceptance.   Dental advisory given  Plan Discussed with: CRNA, Anesthesiologist and Surgeon  Anesthesia Plan Comments:        Anesthesia Quick Evaluation

## 2014-11-12 NOTE — Anesthesia Procedure Notes (Signed)
Procedure Name: Intubation Date/Time: 11/12/2014 10:16 PM Performed by: Joshlyn Beadle S Pre-anesthesia Checklist: Patient identified, Emergency Drugs available, Suction available, Patient being monitored and Timeout performed Patient Re-evaluated:Patient Re-evaluated prior to inductionOxygen Delivery Method: Circle system utilized Preoxygenation: Pre-oxygenation with 100% oxygen Intubation Type: IV induction Ventilation: Mask ventilation without difficulty Laryngoscope Size: Mac and 3 Grade View: Grade I Tube type: Oral Tube size: 7.5 mm Number of attempts: 1 Airway Equipment and Method: Stylet Placement Confirmation: positive ETCO2 and ETT inserted through vocal cords under direct vision Secured at: 22 cm Tube secured with: Tape Dental Injury: Teeth and Oropharynx as per pre-operative assessment  Future Recommendations: Recommend- induction with short-acting agent, and alternative techniques readily available

## 2014-11-12 NOTE — Transfer of Care (Signed)
Immediate Anesthesia Transfer of Care Note  Patient: [name]  Procedure(s) Performed: Procedure(s): Left Hip Hemiarthroplasty (Left)  Patient Location: PACU  Anesthesia Type:General  Level of Consciousness: awake  Airway & Oxygen Therapy: Patient Spontanous Breathing and Patient connected to nasal cannula oxygen  Post-op Assessment: Report given to PACU RN and Post -op Vital signs reviewed and stable  Post vital signs: Reviewed and stable  Complications: No apparent anesthesia complications

## 2014-11-12 NOTE — ED Notes (Addendum)
Patient presents today from home via EMS with a chief complaint of left hip pain after a fall that occurred last night after tripping over a rock. Patient denies LOC, reports severe left hip discomfort, and was given 165mcg of fentanyl prior to arrival.

## 2014-11-13 ENCOUNTER — Ambulatory Visit: Admit: 2014-11-13 | Payer: Self-pay | Admitting: Orthopedic Surgery

## 2014-11-13 ENCOUNTER — Encounter (HOSPITAL_COMMUNITY): Payer: Self-pay | Admitting: Orthopedic Surgery

## 2014-11-13 DIAGNOSIS — S72009A Fracture of unspecified part of neck of unspecified femur, initial encounter for closed fracture: Secondary | ICD-10-CM | POA: Diagnosis present

## 2014-11-13 DIAGNOSIS — F329 Major depressive disorder, single episode, unspecified: Secondary | ICD-10-CM

## 2014-11-13 DIAGNOSIS — S72002D Fracture of unspecified part of neck of left femur, subsequent encounter for closed fracture with routine healing: Secondary | ICD-10-CM

## 2014-11-13 DIAGNOSIS — F32A Depression, unspecified: Secondary | ICD-10-CM

## 2014-11-13 DIAGNOSIS — I5032 Chronic diastolic (congestive) heart failure: Secondary | ICD-10-CM

## 2014-11-13 LAB — CBC
HEMATOCRIT: 38.6 % (ref 36.0–46.0)
HEMOGLOBIN: 12.8 g/dL (ref 12.0–15.0)
MCH: 31.4 pg (ref 26.0–34.0)
MCHC: 33.2 g/dL (ref 30.0–36.0)
MCV: 94.8 fL (ref 78.0–100.0)
Platelets: 142 10*3/uL — ABNORMAL LOW (ref 150–400)
RBC: 4.07 MIL/uL (ref 3.87–5.11)
RDW: 12.5 % (ref 11.5–15.5)
WBC: 8.7 10*3/uL (ref 4.0–10.5)

## 2014-11-13 LAB — BASIC METABOLIC PANEL
Anion gap: 9 (ref 5–15)
BUN: 11 mg/dL (ref 6–23)
CO2: 26 mmol/L (ref 19–32)
Calcium: 8.4 mg/dL (ref 8.4–10.5)
Chloride: 103 mEq/L (ref 96–112)
Creatinine, Ser: 0.71 mg/dL (ref 0.50–1.10)
GFR calc Af Amer: >90 mL/min (ref >90)
GFR, EST NON AFRICAN AMERICAN: 84 mL/min — AB (ref >90)
GLUCOSE: 122 mg/dL — AB (ref 70–99)
Potassium: 3.6 mmol/L (ref 3.5–5.1)
SODIUM: 138 mmol/L (ref 135–145)

## 2014-11-13 SURGERY — ARTHROPLASTY, HIP, TOTAL, ANTERIOR APPROACH
Anesthesia: General | Laterality: Left

## 2014-11-13 MED ORDER — SODIUM CHLORIDE 0.9 % IV SOLN
INTRAVENOUS | Status: DC
  Administered 2014-11-13: 02:00:00 via INTRAVENOUS

## 2014-11-13 MED ORDER — FERROUS SULFATE 325 (65 FE) MG PO TABS
325.0000 mg | ORAL_TABLET | Freq: Three times a day (TID) | ORAL | Status: DC
  Administered 2014-11-13 – 2014-11-17 (×13): 325 mg via ORAL
  Filled 2014-11-13 (×17): qty 1

## 2014-11-13 MED ORDER — PHENOL 1.4 % MT LIQD: 1.0000 | OROMUCOSAL | Status: DC | PRN

## 2014-11-13 MED ORDER — ACETAMINOPHEN 325 MG PO TABS: 650.0000 mg | ORAL_TABLET | Freq: Four times a day (QID) | ORAL | Status: DC | PRN

## 2014-11-13 MED ORDER — MENTHOL 3 MG MT LOZG
1.0000 | LOZENGE | OROMUCOSAL | Status: DC | PRN
  Filled 2014-11-13: qty 9

## 2014-11-13 MED ORDER — ONDANSETRON HCL 4 MG/2ML IJ SOLN: 4.0000 mg | Freq: Four times a day (QID) | INTRAMUSCULAR | Status: DC | PRN

## 2014-11-13 MED ORDER — FENTANYL CITRATE 0.05 MG/ML IJ SOLN
INTRAMUSCULAR | Status: AC
  Filled 2014-11-13: qty 2

## 2014-11-13 MED ORDER — ENOXAPARIN SODIUM 40 MG/0.4ML ~~LOC~~ SOLN
40.0000 mg | SUBCUTANEOUS | Status: DC
  Administered 2014-11-13 – 2014-11-16 (×4): 40 mg via SUBCUTANEOUS
  Filled 2014-11-13 (×5): qty 0.4

## 2014-11-13 MED ORDER — ACETAMINOPHEN 650 MG RE SUPP: 650.0000 mg | Freq: Four times a day (QID) | RECTAL | Status: DC | PRN

## 2014-11-13 MED ORDER — HYDROCODONE-ACETAMINOPHEN 5-325 MG PO TABS
1.0000 | ORAL_TABLET | Freq: Four times a day (QID) | ORAL | Status: DC | PRN
  Administered 2014-11-13 – 2014-11-17 (×8): 1 via ORAL
  Filled 2014-11-13 (×8): qty 1

## 2014-11-13 MED ORDER — POLYETHYLENE GLYCOL 3350 17 G PO PACK
17.0000 g | PACK | Freq: Every day | ORAL | Status: DC | PRN
  Filled 2014-11-13: qty 1

## 2014-11-13 MED ORDER — METOCLOPRAMIDE HCL 5 MG/ML IJ SOLN
5.0000 mg | Freq: Three times a day (TID) | INTRAMUSCULAR | Status: DC | PRN
  Filled 2014-11-13: qty 2

## 2014-11-13 MED ORDER — MORPHINE SULFATE 2 MG/ML IJ SOLN: 0.5000 mg | INTRAMUSCULAR | Status: DC | PRN

## 2014-11-13 MED ORDER — ENSURE COMPLETE PO LIQD
237.0000 mL | Freq: Two times a day (BID) | ORAL | Status: DC
  Administered 2014-11-13 – 2014-11-17 (×4): 237 mL via ORAL

## 2014-11-13 MED ORDER — ONDANSETRON HCL 4 MG PO TABS: 4.0000 mg | ORAL_TABLET | Freq: Four times a day (QID) | ORAL | Status: DC | PRN

## 2014-11-13 MED ORDER — CEFAZOLIN SODIUM-DEXTROSE 2-3 GM-% IV SOLR
2.0000 g | Freq: Four times a day (QID) | INTRAVENOUS | Status: AC
  Administered 2014-11-13 (×2): 2 g via INTRAVENOUS
  Filled 2014-11-13 (×2): qty 50

## 2014-11-13 MED ORDER — ENOXAPARIN SODIUM 40 MG/0.4ML ~~LOC~~ SOLN: 40.0000 mg | SUBCUTANEOUS | Status: DC

## 2014-11-13 MED ORDER — METOCLOPRAMIDE HCL 10 MG PO TABS: 5.0000 mg | ORAL_TABLET | Freq: Three times a day (TID) | ORAL | Status: DC | PRN

## 2014-11-13 NOTE — Op Note (Signed)
NAMEDAMIAH, MCDONALD               ACCOUNT NO.:  [id number]  MEDICAL RECORD NO.:  [id number]  LOCATION:  WA06                         FACILITY:  [facility]  PHYSICIAN:  Doran Heater. Veverly Fells, M.D. DATE OF BIRTH:  11/20/1940  DATE OF PROCEDURE:  11/12/2014 DATE OF DISCHARGE:                              OPERATIVE REPORT   PREOPERATIVE DIAGNOSIS:  Left displaced femoral neck fracture.  POSTOPERATIVE DIAGNOSIS:  Left displaced femoral neck fracture.  PROCEDURE PERFORMED:  Left hip hemiarthroplasty using [employer]uy Tri-Lock prosthesis.  ATTENDING SURGEON:  Doran Heater. Veverly Fells, M.D.  ASSISTANT:  Abbott Pao. Dixon, PA-C, who was scrubbed the entire procedure and necessary for satisfactory completion of surgery.  ANESTHESIA:  General anesthesia was used.  ESTIMATED BLOOD LOSS:  200 mL.  FLUID REPLACEMENT:  1200 mL of crystalloid.  INSTRUMENT COUNTS:  Correct.  COMPLICATIONS:  There were no complications.  ANTIBIOTICS:  Perioperative antibiotics were given.  INDICATIONS:  The patient is a 74 year old female who suffered ground- level fall yesterday.  The patient complained of immediate pain in her left hip and groin area, was not able to bear weight.  The patient presented to the emergency department where x-rays demonstrated displaced femoral neck fracture.  Discussed with the patient the need for operative hemiarthroplasty to restore proximal femoral anatomy and allow the patient to mobilize and weight bear.  She agreed to this and informed consent was obtained.  DESCRIPTION OF PROCEDURE:  After an adequate level of anesthesia was achieved, the patient was positioned in the right lateral decubitus position with the left hip up.  The patient's down leg was padded appropriately.  An axillary roll was utilized.  All neurovascular structures were padded appropriately.  The patient was secured.  Time- out was called.  Sterile prep and drape of the hip and leg performed. Following the time-out, we  made a posterior approach to the hip starting at the vastus ridge and extending posteriorly and proximally for about 8 cm.  Dissection down through subcutaneous tissues.  We identified the tensor fascia lata and divided that in line with the skin incision. Short external rotators were divided sharply off the posterior aspect of the femur with progressive internal rotation.  We identified the fractured femur, fracture hematoma was noted and evacuated.  We went ahead then and made our freshening cut on her femoral neck.  Basically the fracture was in the appropriate alignment and we just had to freshen that up with an oscillating saw.  We were 1 fingerbreadth above the lesser trochanter.  We then removed excess bone using rongeur.  We then retrieved the femoral head and sized it to a 44.  The ligament teres was removed.  The remaining acetabulum looked to be in good shape and intact labrum.  Next, we completed our preparation on the femoral side using a Star reamer for the proximal femur followed by canal finder and lateralizing reamer and then we started the broach only system, Tri-Lock system from De[employer]uy, and we broached up to a size 4 standard Tri-Lock stem.  We placed the 4 standard Tri-Lock trial with a 0 neck adapter and a 44 head and reduced the hip.  We were pleased with our  leg lengths and soft tissue balancing and stability.  We removed the trial components, thoroughly irrigated, and then placed our real size 4 standard Tri-Lock stem, impacted that in 20 degrees of anteversion.  We were pleased with that positioning and security and then went ahead and selected a 0 neck adapter and placed that under 44 ball and impacted that in the back table and then placed that neck adapter and ball onto the trunnion and impacted that.  We then reduced the hip again nice and stable, negative Shuck.  We were able to flex her to 90 degrees and internally rotate her maximally with no instability.  We  then irrigated thoroughly, repaired the capsule with interrupted #1 Ethibond suture followed by Ethibond repair of the piriformis back to the gluteus medius/greater trochanter and then a tensor fascia lata repaired with interrupted #1 Vicryl figure- of-eight suture followed by 0 and 2-0 layered subcutaneous closure and staples.  Sterile compressive bandage was applied.  The patient was transported in a knee mobilizer in a stable condition to the PACU.     Doran Heater. Veverly Fells, M.D.     SRN/MEDQ  D:  11/12/2014  T:  11/13/2014  Job:  [id number]

## 2014-11-13 NOTE — Clinical Social Work Psychosocial (Signed)
Clinical Social Work Department BRIEF PSYCHOSOCIAL ASSESSMENT 11/13/2014  Patient:  [name], [name]     Account Number:  [id number]     Admit date:  11/12/2014  Clinical Social Worker:  Kingsley Spittle, CLINICAL SOCIAL WORKER  Date/Time:  11/13/2014 11:00 AM  Referred by:  Physician  Date Referred:  11/13/2014 Referred for  SNF Placement   Other Referral:   N/A   Interview type:  Patient Other interview type:   N/A    PSYCHOSOCIAL DATA Living Status:  HUSBAND Admitted from facility:   Level of care:   Primary support name:  [name] Primary support relationship to patient:  SPOUSE Degree of support available:   Support is strong.    CURRENT CONCERNS Current Concerns  Post-Acute Placement   Other Concerns:   N/A    SOCIAL WORK ASSESSMENT / PLAN BSW intern spoke with patient and patient's daughter at bedside about her plans after discharging the hospital. The patient stated that she wanted to return back home with her family because she has a lot support surrounding her, however she was willing to discuss her other options. After speaking with her for a few minutes about SNP placements, the patient seemed very overwhelmed and wanted time to think about her decision. BSW intern revisited with patient and patient confirms that she does not want SNF at discharge. RNCM has been notified of situation.   Assessment/plan status:  No Further Intervention Required Other assessment/ plan:   NONE   Information/referral to community resources:   CSW contact info and SNF list were given to patient.    PATIENT'S/FAMILY'S RESPONSE TO PLAN OF CARE: Patient and patient's family plan to discharge home once medically stable. BSW intern signing off at this time.       Kingsley Spittle, [place] Intern, [phone]

## 2014-11-13 NOTE — Progress Notes (Addendum)
PROGRESS NOTE  [name] OVZ:[id number] DOB: April 01, 1941 DOA: 11/12/2014 PCP: Horatio Pel, MD  Assessment/Plan: Left displaced femoral neck fracture -Status post left hemiarthroplasty 11/12/2014 -Appreciate orthopedics -PT/OT -May need short-term skilled nursing facility -DVT prophylaxis per ortho Chronic diastolic CHF -Compensated presently Hypothyroidism -Continue Synthroid Depression -Continue Zoloft History of VFib Arrest 2005 -s/p ICD--generator changed 01/18/13 Mild Thrombocytopenia -monitor for now Family Communication:   Pt at beside Disposition Plan:   SNF when cleared by ortho          Procedures/Studies: Dg Chest 1 View  11/12/2014   CLINICAL DATA:  Tripped and fell.  Left hip pain.  EXAM: CHEST - 1 VIEW  COMPARISON:  01/19/2013  FINDINGS: The cardiac silhouette, mediastinal and hilar contours are within normal limits and stable. The pacer wires/ AICD are stable. No acute pulmonary findings. No pleural effusion. The bony thorax is intact.  IMPRESSION: No acute cardiopulmonary findings.   Electronically Signed   By: Kalman Jewels M.D.   On: 11/12/2014 16:15   Pelvis Portable  11/13/2014   CLINICAL DATA:  Postop left hip.  EXAM: PORTABLE PELVIS 1-2 VIEWS  COMPARISON:  Pelvis radiography from earlier the same day.  FINDINGS: New bipolar left hip hemiarthroplasty. No dislocation or periprosthetic fracture. The imaged right hip is intact and located.  IMPRESSION: New left hip hemiarthroplasty.  No adverse findings.   Electronically Signed   By: Jorje Guild M.D.   On: 11/13/2014 00:38   Dg Hip Unilat With Pelvis 2-3 Views Left  11/12/2014   CLINICAL DATA:  Tripped in driveway yesterday with persistent left hip pain, initial encounter  EXAM: DG HIP W/ PELVIS 2-3V*L*  COMPARISON:  None.  FINDINGS: There are changes consistent with a subcapital femoral neck fracture with some impaction and angulation at the fracture site. No other bony  abnormality is seen.  IMPRESSION: Subcapital left femoral neck fracture.   Electronically Signed   By: Inez Catalina M.D.   On: 11/12/2014 16:11         Subjective: Patient denies fevers, chills, headache, chest pain, dyspnea, nausea, vomiting, diarrhea, abdominal pain, dysuria, hematuria   Objective: Filed Vitals:   11/13/14 0437 11/13/14 0537 11/13/14 0637 11/13/14 0800  BP: 139/61 138/61 129/63 128/53  Pulse: 101 101  102  Temp: 99 F (37.2 C) 99 F (37.2 C) 99 F (37.2 C)   TempSrc:    Oral  Resp: 17 18  18   Height:      Weight:      SpO2: 100% 100% 100% 98%    Intake/Output Summary (Last 24 hours) at 11/13/14 0948 Last data filed at 11/13/14 0700  Gross per 24 hour  Intake  972.5 ml  Output    925 ml  Net   47.5 ml   Weight change:  Exam:   General:  Pt is alert, follows commands appropriately, not in acute distress  HEENT: No icterus, No thrush, [place]/AT  Cardiovascular: RRR, S1/S2, no rubs, no gallops  Respiratory: CTA bilaterally, no wheezing, no crackles, no rhonchi  Abdomen: Soft/+BS, non tender, non distended, no guarding  Extremities: No edema, No lymphangitis, No petechiae, No rashes, no synovitis  Data Reviewed: Basic Metabolic Panel:  Recent Labs Lab 11/12/14 1533 11/13/14 0533  NA 138 138  K 3.7 3.6  CL 102 103  CO2 28 26  GLUCOSE 108* 122*  BUN 14 11  CREATININE 0.75 0.71  CALCIUM 9.4 8.4   Liver  Function Tests: No results for input(s): AST, ALT, ALKPHOS, BILITOT, PROT, ALBUMIN in the last 168 hours. No results for input(s): LIPASE, AMYLASE in the last 168 hours. No results for input(s): AMMONIA in the last 168 hours. CBC:  Recent Labs Lab 11/12/14 1533 11/13/14 0533  WBC 10.0 8.7  NEUTROABS 8.4*  --   HGB 14.1 12.8  HCT 42.0 38.6  MCV 92.7 94.8  PLT 179 142*   Cardiac Enzymes: No results for input(s): CKTOTAL, CKMB, CKMBINDEX, TROPONINI in the last 168 hours. BNP: Invalid input(s): POCBNP CBG: No results for  input(s): GLUCAP in the last 168 hours.  Recent Results (from the past 240 hour(s))  Surgical pcr screen     Status: None   Collection Time: 11/12/14  7:01 PM  Result Value Ref Range Status   MRSA, PCR NEGATIVE NEGATIVE Final   Staphylococcus aureus NEGATIVE NEGATIVE Final    Comment:        The Xpert SA Assay (FDA approved for NASAL specimens in patients over 28 years of age), is one component of a comprehensive surveillance program.  Test performance has been validated by [facility] for patients greater than or equal to 42 year old. It is not intended to diagnose infection nor to guide or monitor treatment.      Scheduled Meds: .  ceFAZolin (ANCEF) IV  2 g Intravenous Q6H  . enoxaparin (LOVENOX) injection  40 mg Subcutaneous Q24H  . fentaNYL      . ferrous sulfate  325 mg Oral TID PC  . levothyroxine  112 mcg Oral QAC breakfast  . sertraline  100 mg Oral Daily   Continuous Infusions: . sodium chloride 50 mL/hr at 11/13/14 0133     Vada Swift, DO  [facility] Pager [phone]  If 7PM-7AM, please contact night-coverage [email or web address] Password [email or web address] 11/13/2014, 9:48 AM   LOS: 1 day

## 2014-11-13 NOTE — Progress Notes (Signed)
Orthopedic Tech Progress Note Patient Details:  [name] 13-Jul-1941 [id number] Patient refused application of OHF. Stated she could not use it. Patient ID: [name], female   DOB: 01-Feb-1941, 74 y.o.   MRN: [id number]   [name] 11/13/2014, 12:58 PM

## 2014-11-13 NOTE — Progress Notes (Signed)
INITIAL NUTRITION ASSESSMENT  DOCUMENTATION CODES Per approved criteria  -Not Applicable   INTERVENTION: - Ensure Complete po BID, each supplement provides 350 kcal and 13 grams of protein - RD will continue to monitor  NUTRITION DIAGNOSIS: Inadequate oral intake related to hip fracture as evidenced by reported poor po intake.   Goal: Pt to meet >/= 90% of their estimated nutrition needs   Monitor:  Weight trend, po intake, acceptance of supplements, labs  Reason for Assessment: Consult per hip fracture protocol  74 y.o. female  Admitting Dx: <principal problem not specified>  ASSESSMENT: 74 yo female with history of hypothyroidism and CHF, diastolic and systolic CHF, s/p ICD placement, presented to [facility] ED after an episode of fall at home.  1/21- Pt s/p Left hip hemiarthroplasty using DePuy Tri-Lock Prosthesis.  - Pt reports poor appetite x 3 days. She ate half of a grilled cheese sandwich today for lunch. Agreed to try nutritional supplements to promote wound healing. No recent weight loss. No signs of fat or muscle wasting  Labs reviewed  Height: Ht Readings from Last 1 Encounters:  11/12/14 5\' 3"  (1.6 m)    Weight: Wt Readings from Last 1 Encounters:  11/12/14 153 lb 8 oz (69.627 kg)    Ideal Body Weight: 52.4 kg  % Ideal Body Weight: 133%  Wt Readings from Last 10 Encounters:  11/12/14 153 lb 8 oz (69.627 kg)  04/10/14 149 lb (67.586 kg)  04/04/13 143 lb (64.864 kg)  01/18/13 138 lb (62.596 kg)  12/19/12 138 lb 3.2 oz (62.687 kg)  12/26/11 143 lb 1.9 oz (64.919 kg)  12/06/11 143 lb (64.864 kg)  11/23/11 143 lb 12.8 oz (65.227 kg)  01/04/11 147 lb (66.679 kg)  12/15/09 147 lb (66.679 kg)   BMI:  Body mass index is 27.2 kg/(m^2).  Estimated Nutritional Needs: Kcal: 1900-2100 Protein: 95-115 g Fluid: 1.9-2.1 L/day  Skin: closed incision on left leg  Diet Order: Diet regular  EDUCATION NEEDS: -Education needs addressed   Intake/Output Summary  (Last 24 hours) at 11/13/14 1515 Last data filed at 11/13/14 0952  Gross per 24 hour  Intake 1212.5 ml  Output    925 ml  Net  287.5 ml    Last BM: prior to admission   Labs:   Recent Labs Lab 11/12/14 1533 11/13/14 0533  NA 138 138  K 3.7 3.6  CL 102 103  CO2 28 26  BUN 14 11  CREATININE 0.75 0.71  CALCIUM 9.4 8.4  GLUCOSE 108* 122*    CBG (last 3)  No results for input(s): GLUCAP in the last 72 hours.  Scheduled Meds: . enoxaparin (LOVENOX) injection  40 mg Subcutaneous Q24H  . ferrous sulfate  325 mg Oral TID PC  . levothyroxine  112 mcg Oral QAC breakfast  . sertraline  100 mg Oral Daily    Continuous Infusions: . sodium chloride 50 mL/hr at 11/13/14 0133    Past Medical History  Diagnosis Date  . Arthritis   . Arthritis   . Congestive heart failure   . Cardiac defibrillator in place 2005  . Depression   . Osteoporosis   . Thyroid disease   . Osteoporosis   . Thyroid disease   . Fibromyalgia   . Gallbladder disease     Past Surgical History  Procedure Laterality Date  . Colonoscopy    . Polypectomy    . Colonoscopy  2010    Adenomatous with high grade dysplasia  . Cardiac catheterization  2005  . Cardiac defibrillator placement  12-23-2003    MDT single chamber ICD implanted by Dr Caryl Comes [phone] lead)  . Implantable cardioverter defibrillator generator change  01-18-2013    MDT ICD gen change with insertion of new RV lead, capping of 6949 lead by Dr Lovena Le  . Implantable cardioverter defibrillator generator change N/A 01/18/2013    Procedure: IMPLANTABLE CARDIOVERTER DEFIBRILLATOR GENERATOR CHANGE;  Surgeon: Evans Lance, MD;  Location: [facility] CATH LAB;  Service: Cardiovascular;  Laterality: N/A;  . Lead revision N/A 01/18/2013    Procedure: LEAD REVISION;  Surgeon: Evans Lance, MD;  Location: [facility] CATH LAB;  Service: Cardiovascular;  Laterality: N/A;    Laurette Schimke MS, RD, LDN

## 2014-11-13 NOTE — Progress Notes (Signed)
Orthopedics Progress Note  Subjective: I feel better  Objective:  Filed Vitals:   11/13/14 0637  BP: 129/63  Pulse:   Temp: 99 F (37.2 C)  Resp:     General: Awake and alert  Musculoskeletal: Left hip dressing CDI, NVI Neurovascularly intact  Lab Results  Component Value Date   WBC 8.7 11/13/2014   HGB 12.8 11/13/2014   HCT 38.6 11/13/2014   MCV 94.8 11/13/2014   PLT 142* 11/13/2014       Component Value Date/Time   NA 138 11/13/2014 0533   K 3.6 11/13/2014 0533   CL 103 11/13/2014 0533   CO2 26 11/13/2014 0533   GLUCOSE 122* 11/13/2014 0533   BUN 11 11/13/2014 0533   CREATININE 0.71 11/13/2014 0533   CALCIUM 8.4 11/13/2014 0533   GFRNONAA 84* 11/13/2014 0533   GFRAA >90 11/13/2014 0533    Lab Results  Component Value Date   INR 1.03 11/12/2014   INR 1.0 01/08/2013    Assessment/Plan: POD #1 s/p Procedure(s): Left Hip Hemiarthroplasty WBAT, OOB, mobilization DVT prophylaxis - Lovenox and mechanical PT, OT, D/C planning, Short term SNF likely  [employer]. Veverly Fells, MD 11/13/2014 7:36 AM

## 2014-11-13 NOTE — Care Management Note (Addendum)
    Page 1 of 1   11/17/2014     12:59:38 PM CARE MANAGEMENT NOTE 11/17/2014  Patient:  [name], [name]   Account Number:  [id number]  Date Initiated:  11/13/2014  Documentation initiated by:  Tomi Bamberger  Subjective/Objective Assessment:   dx hip fx, s/p hip surgery  admit- lives with spouse.     Action/Plan:   pt eval-snf   Anticipated DC Date:  11/17/2014   Anticipated DC Plan:  SKILLED NURSING FACILITY  In-house referral  Clinical Social Worker      DC Planning Services  CM consult      Choice offered to / List presented to:             Status of service:  Completed, signed off Medicare Important Message given?  YES (If response is "NO", the following Medicare IM given date fields will be blank) Date Medicare IM given:  11/17/2014 Medicare IM given by:  Tomi Bamberger Date Additional Medicare IM given:   Additional Medicare IM given by:    Discharge Disposition:  [place]  Per UR Regulation:  Reviewed for med. necessity/level of care/duration of stay  If discussed at [place] of Stay Meetings, dates discussed:    Comments:  11/17/14 [place], BSN [phone] patient is for dc to snf today, [employer].  1/22/1 [place], BSN [phone] NCM was informed by MD that patient has decided to go to snf , plan is for dc today, CSW notified.  [place] notified that patient is going to snf today.  11/13/14 [place], BSN [phone] NCM was informed by CSW that patient does not want to go to [facility], she wants to go home with hh services.  NCM spoke with patient , she chose [facility] for hhpt and 3 n 1 and rolling walker.  Referral made to [facility], [place] notified and Joellen Jersey for dme.   Soc will begin 24-48 hrs post dc.

## 2014-11-13 NOTE — Anesthesia Postprocedure Evaluation (Signed)
  Anesthesia Post-op Note  Patient: [name]  Procedure(s) Performed: Procedure(s): Left Hip Hemiarthroplasty (Left)  Patient Location: PACU  Anesthesia Type:General  Level of Consciousness: awake  Airway and Oxygen Therapy: Patient Spontanous Breathing  Post-op Pain: mild  Post-op Assessment: Post-op Vital signs reviewed  Post-op Vital Signs: Reviewed  Last Vitals:  Filed Vitals:   11/13/14 0000  BP: 151/80  Pulse: 107  Temp:   Resp: 16    Complications: No apparent anesthesia complications

## 2014-11-13 NOTE — Progress Notes (Signed)
Pt. Arrived to the floor alert and stable, post-op. Pt. Denies pain at this time. CCMD notified of pts. Return to the floor. Pt. Placed on telemetry. Call light in place. RN will continue to monitor pt.for changes in condition. Akanksha Bellmore, Katherine Roan

## 2014-11-13 NOTE — Progress Notes (Signed)
Pt. Alert and stable. VSS. No s/s of distress or discomfort noted. Pt. Denies pain at this time. Pt. Transported to OR via bed with transporter. CCMD notified that pt. Off the floor.

## 2014-11-13 NOTE — Evaluation (Signed)
Physical Therapy Evaluation Patient Details Name: [name] MRN: [id number] DOB: 04-Apr-1941 Today's Date: 11/13/2014   History of Present Illness  Pt is 74 yo female with history of hypothyroidism and CHF, diastolic and systolic CHF, s/p ICD placement, presented to [facility] ED after an episode of fall at home. Pt denies similar events in the past, no chest pain prior to the event, no shortness of breath, no abd or urinary concerns. After the fall, pt reports intermittent left hip pain, throbbing and sharp, 10/10 in severity when present, worse with movement and with no specific alleviating factors  Clinical Impression  Pt admitted with/for fall s/p L hip hemiarthroplasty.  Pt currently limited functionally due to the problems listed. ( See problems list.)   Pt will benefit from PT to maximize function and safety in order to get ready for next venue listed below.     Follow Up Recommendations SNF;Supervision/Assistance - 24 hour (unless progresses quicker than expected.)    Equipment Recommendations  Rolling walker with 5" wheels;3in1 (PT)    Recommendations for Other Services       Precautions / Restrictions Precautions Precautions: Fall Restrictions Weight Bearing Restrictions: Yes LLE Weight Bearing: Weight bearing as tolerated      Mobility  Bed Mobility Overal bed mobility: Needs Assistance Bed Mobility: Supine to Sit     Supine to sit: Mod assist (+2 would be helpful, but not available)     General bed mobility comments: assisted bridging, movement of LE's and with truncal assist  Transfers Overall transfer level: Needs assistance   Transfers: Sit to/from Stand;Stand Pivot Transfers Sit to Stand: Mod assist Stand pivot transfers: Mod assist       General transfer comment: cues for sequencing and technique. pt with difficulty moving either foot to advance the pivot to chair  with fears of falling and increase in pain with stepping  Ambulation/Gait                 Stairs            Wheelchair Mobility    Modified Rankin (Stroke Patients Only)       Balance Overall balance assessment: Needs assistance Sitting-balance support: No upper extremity supported;Feet supported Sitting balance-Leahy Scale: Fair Sitting balance - Comments: w/shift biased off L hip, but no assist needed     Standing balance-Leahy Scale: Poor Standing balance comment: Needs RW                             Pertinent Vitals/Pain Pain Assessment: 0-10 Pain Score: 10-Worst pain ever Pain Descriptors / Indicators: Aching;Sharp Pain Intervention(s): Monitored during session;Premedicated before session    Home Living Family/patient expects to be discharged to:: Unsure Living Arrangements: Spouse/significant other Available Help at Discharge: Family Type of Home: House Home Access: Stairs to enter     Home Layout: One level        Prior Function Level of Independence: Independent               Hand Dominance        Extremity/Trunk Assessment   Upper Extremity Assessment: Defer to OT evaluation           Lower Extremity Assessment: RLE deficits/detail;LLE deficits/detail RLE Deficits / Details: WFL but tests weaker due to aches/pains from fall LLE Deficits / Details: difficult to assess due to pain, assisted knee/hip ROM 70* of flexion.  grossly 3+/5  Cervical / Trunk Assessment: Normal  Communication   Communication: No difficulties  Cognition Arousal/Alertness: Awake/alert Behavior During Therapy: WFL for tasks assessed/performed Overall Cognitive Status: Within Functional Limits for tasks assessed                      General Comments      Exercises Total Joint Exercises Ankle Circles/Pumps: AROM;Other reps (comment);Supine (30) Heel Slides: AAROM;Both;AROM;10 reps;Supine Goniometric ROM: ~70* AAROM in flexion      Assessment/Plan    PT Assessment Patient needs continued PT services  PT  Diagnosis Difficulty walking;Generalized weakness;Acute pain   PT Problem List Decreased strength;Decreased range of motion;Decreased activity tolerance;Decreased mobility;Decreased knowledge of use of DME;Decreased knowledge of precautions;Pain  PT Treatment Interventions DME instruction;Gait training;Functional mobility training;Therapeutic activities;Balance training;Patient/family education   PT Goals (Current goals can be found in the Care Plan section) Acute Rehab PT Goals Patient Stated Goal: back home independent PT Goal Formulation: With patient Time For Goal Achievement: 11/20/14 Potential to Achieve Goals: Good    Frequency Min 3X/week   Barriers to discharge        Co-evaluation               End of Session   Activity Tolerance: Patient tolerated treatment well;Patient limited by pain Patient left: in chair;with call bell/phone within reach;with nursing/sitter in room Nurse Communication: Mobility status         Time: 0932-1006 PT Time Calculation (min) (ACUTE ONLY): 34 min   Charges:   PT Evaluation $Initial PT Evaluation Tier I: 1 Procedure PT Treatments $Therapeutic Activity: 8-22 mins   PT G Codes:        Prescilla Monger, Tessie Fass 11/13/2014, 11:33 AM   11/13/2014  Donnella Sham, PT [phone]  (pager)

## 2014-11-13 NOTE — Evaluation (Signed)
Occupational Therapy Evaluation Patient Details Name: [name] MRN: [id number] DOB: Mar 12, 1941 Today's Date: 11/13/2014    History of Present Illness Pt is 74 yo female with history of hypothyroidism and CHF, diastolic and systolic CHF, s/p ICD placement, presented to [facility] ED after an episode of fall at home. Pt denies similar events in the past, no chest pain prior to the event, no shortness of breath, no abd or urinary concerns. After the fall, pt reports intermittent left hip pain, throbbing and sharp, 10/10 in severity when present, worse with movement and with no specific alleviating factors   Clinical Impression   PTA pt lived at home and was independent with ADLs. Pt is currently limited by L hip pain which impairs independence with ADLs. Pt requires (A) with LB ADLs and mod (A) with transfers. Pt will benefit from [place] rehab at d/c pending progress while inpatient. Pt will benefit from acute OT to address ADLs and functional transfers.     Follow Up Recommendations  SNF;Supervision/Assistance - 24 hour    Equipment Recommendations  3 in 1 bedside comode    Recommendations for Other Services       Precautions / Restrictions Precautions Precautions: Fall Restrictions Weight Bearing Restrictions: Yes LLE Weight Bearing: Weight bearing as tolerated      Mobility Bed Mobility Overal bed mobility: Needs Assistance Bed Mobility: Sit to Supine       Sit to supine: Mod assist   General bed mobility comments: Pt had difficulty returning to bed and required mod (A) to assist with LEs and positioning in bed.   Transfers Overall transfer level: Needs assistance Equipment used: Rolling walker (2 wheeled) Transfers: Sit to/from Stand Sit to Stand: Mod assist         General transfer comment: cues for sequencing and technique. pt with difficulty moving either foot to advance the pivot to chair  with fears of falling and increase in pain with stepping         ADL  Overall ADL's : Needs assistance/impaired Eating/Feeding: Independent;Sitting   Grooming: Set up;Sitting   Upper Body Bathing: Set up;Sitting   Lower Body Bathing: Sit to/from stand;Moderate assistance   Upper Body Dressing : Set up;Sitting   Lower Body Dressing: Sit to/from stand;Maximal assistance   Toilet Transfer: Ambulation;RW;BSC;Moderate assistance Toilet Transfer Details (indicate cue type and reason): OT entered room with pt making transfer from [facility] to bed with assistance of her daughter Toileting- Water quality scientist and Hygiene: Sit to/from stand;Moderate assistance       Functional mobility during ADLs: Minimal assistance;Rolling walker General ADL Comments: OT entered room and pt was making transfer with her daughter's assistance. OT assisted pt in safe transfer and reinforced education to wait for staff before transferring. Pt reports increased pain and decreased ease of transfer this afternoon compared to this morning.         Perception Perception Perception Tested?: No   Praxis Praxis Praxis tested?: Within functional limits    Pertinent Vitals/Pain Pain Assessment: 0-10 Pain Score: 5  Pain Location: L hip Pain Descriptors / Indicators: Aching;Sharp Pain Intervention(s): Limited activity within patient's tolerance;Monitored during session;Repositioned     Hand Dominance     Extremity/Trunk Assessment Upper Extremity Assessment Upper Extremity Assessment: Overall WFL for tasks assessed   Lower Extremity Assessment Lower Extremity Assessment: Defer to PT evaluation   Cervical / Trunk Assessment Cervical / Trunk Assessment: Normal   Communication Communication Communication: No difficulties   Cognition Arousal/Alertness: Awake/alert Behavior During Therapy: [facility]  for tasks assessed/performed Overall Cognitive Status: Within Functional Limits for tasks assessed                                Home Living Family/patient expects to be  discharged to:: Unsure Living Arrangements: Spouse/significant other Available Help at Discharge: Family Type of Home: House Home Access: Stairs to enter     Home Layout: One level     Bathroom Shower/Tub: Occupational psychologist: Standard     Home Equipment: None          Prior Functioning/Environment Level of Independence: Independent             OT Diagnosis: Generalized weakness;Acute pain   OT Problem List: Decreased strength;Decreased range of motion;Decreased activity tolerance;Impaired balance (sitting and/or standing);Decreased knowledge of use of DME or AE;Decreased knowledge of precautions;Pain   OT Treatment/Interventions: Self-care/ADL training;Therapeutic exercise;Energy conservation;DME and/or AE instruction;Therapeutic activities;Patient/family education;Balance training    OT Goals(Current goals can be found in the care plan section) Acute Rehab OT Goals Patient Stated Goal: back home independent OT Goal Formulation: With patient Time For Goal Achievement: 11/27/14 Potential to Achieve Goals: Good  OT Frequency: Min 2X/week    End of Session Equipment Utilized During Treatment: Gait belt  Activity Tolerance: Patient tolerated treatment well Patient left: in bed;with call bell/phone within reach;with family/visitor present   Time: 1420-1440 OT Time Calculation (min): 20 min Charges:  OT General Charges $OT Visit: 1 Procedure OT Evaluation $Initial OT Evaluation Tier I: 1 Procedure G-Codes:    Juluis Rainier 11-14-2014, 5:39 PM  Cyndie Chime, OTR/L Occupational Therapist [phone] (pager)

## 2014-11-13 NOTE — Progress Notes (Signed)
Utilization review completed. Roshonda Sperl, RN, BSN. 

## 2014-11-14 DIAGNOSIS — W19XXXS Unspecified fall, sequela: Secondary | ICD-10-CM

## 2014-11-14 LAB — BASIC METABOLIC PANEL
ANION GAP: 4 — AB (ref 5–15)
BUN: 7 mg/dL (ref 6–23)
CALCIUM: 8.4 mg/dL (ref 8.4–10.5)
CO2: 31 mmol/L (ref 19–32)
CREATININE: 0.59 mg/dL (ref 0.50–1.10)
Chloride: 101 mEq/L (ref 96–112)
GFR calc Af Amer: >90 mL/min (ref >90)
GFR calc non Af Amer: 89 mL/min — ABNORMAL LOW (ref >90)
Glucose, Bld: 124 mg/dL — ABNORMAL HIGH (ref 70–99)
POTASSIUM: 3.5 mmol/L (ref 3.5–5.1)
Sodium: 136 mmol/L (ref 135–145)

## 2014-11-14 LAB — CBC
HCT: 34.5 % — ABNORMAL LOW (ref 36.0–46.0)
Hemoglobin: 11.5 g/dL — ABNORMAL LOW (ref 12.0–15.0)
MCH: 31.5 pg (ref 26.0–34.0)
MCHC: 33.3 g/dL (ref 30.0–36.0)
MCV: 94.5 fL (ref 78.0–100.0)
Platelets: 120 10*3/uL — ABNORMAL LOW (ref 150–400)
RBC: 3.65 MIL/uL — ABNORMAL LOW (ref 3.87–5.11)
RDW: 12.4 % (ref 11.5–15.5)
WBC: 6.6 10*3/uL (ref 4.0–10.5)

## 2014-11-14 MED ORDER — HYDROCODONE-ACETAMINOPHEN 5-325 MG PO TABS: 1.0000 | ORAL_TABLET | Freq: Four times a day (QID) | ORAL | Status: DC | PRN

## 2014-11-14 MED ORDER — ENSURE COMPLETE PO LIQD: 237.0000 mL | Freq: Two times a day (BID) | ORAL | Status: DC

## 2014-11-14 NOTE — Progress Notes (Signed)
Orthopedics Progress Note  Subjective: I am not having much pain  Objective:  Filed Vitals:   11/14/14 0447  BP: 130/47  Pulse: 99  Temp: 99.2 F (37.3 C)  Resp: 16    General: Awake and alert  Musculoskeletal: left hip dressing CDI, wound benign. Min swelling No cords, NVI Neurovascularly intact  Lab Results  Component Value Date   WBC 6.6 11/14/2014   HGB 11.5* 11/14/2014   HCT 34.5* 11/14/2014   MCV 94.5 11/14/2014   PLT 120* 11/14/2014       Component Value Date/Time   NA 136 11/14/2014 0627   K 3.5 11/14/2014 0627   CL 101 11/14/2014 0627   CO2 31 11/14/2014 0627   GLUCOSE 124* 11/14/2014 0627   BUN 7 11/14/2014 0627   CREATININE 0.59 11/14/2014 0627   CALCIUM 8.4 11/14/2014 0627   GFRNONAA 89* 11/14/2014 0627   GFRAA >90 11/14/2014 2993    Lab Results  Component Value Date   INR 1.03 11/12/2014   INR 1.0 01/08/2013    Assessment/Plan: POD #2  s/p Procedure(s): Left Hip Hemiarthroplasty Stable from ortho standpoint. PAtient mobilizing well.   DVT prophylaxis - mechanical and Lovenox D/C planning - SNF   Doran Heater. Veverly Fells, MD 11/14/2014 12:33 PM

## 2014-11-14 NOTE — Progress Notes (Signed)
Physical Therapy Treatment Patient Details Name: [name] MRN: [id number] DOB: Mar 28, 1941 Today's Date: 11/14/2014    History of Present Illness Pt is 74 yo female with history of hypothyroidism and CHF, diastolic and systolic CHF, s/p ICD placement, presented to [facility] ED after an episode of fall at home. Pt denies similar events in the past, no chest pain prior to the event, no shortness of breath, no abd or urinary concerns. After the fall, pt reports intermittent left hip pain, throbbing and sharp, 10/10 in severity when present, worse with movement and with no specific alleviating factors    PT Comments    Pt overall progressing well with PT.  Still recommending SNF   Follow Up Recommendations  SNF;Supervision/Assistance - 24 hour     Equipment Recommendations  Rolling walker with 5" wheels;3in1 (PT)    Recommendations for Other Services       Precautions / Restrictions Precautions Precautions: Fall Restrictions Weight Bearing Restrictions: No LLE Weight Bearing: Weight bearing as tolerated    Mobility  Bed Mobility Overal bed mobility: Needs Assistance Bed Mobility: Sit to Supine     Supine to sit: Mod assist;HOB elevated (pt unwilling to keep HOB lower)     General bed mobility comments: Pt with difficulty transitioning supine to sit needing max cues however due to pain didn't follow well.  Transfers Overall transfer level: Needs assistance Equipment used: Rolling walker (2 wheeled) Transfers: Sit to/from [facility] Sit to Stand: Min guard Stand pivot transfers: Min assist       General transfer comment: improved sequencing and use of UEs on RW.    Ambulation/Gait Ambulation/Gait assistance: Min assist Ambulation Distance (Feet): 2 Feet Assistive device: Rolling walker (2 wheeled) Gait Pattern/deviations: Step-to pattern         Stairs            Wheelchair Mobility    Modified Rankin (Stroke Patients Only)        Balance                                    Cognition Arousal/Alertness: Awake/alert Behavior During Therapy: WFL for tasks assessed/performed Overall Cognitive Status: Within Functional Limits for tasks assessed                      Exercises Total Joint Exercises Ankle Circles/Pumps: AROM;Both;10 reps (reinforced to complete hourly)    General Comments        Pertinent Vitals/Pain Pain Assessment: 0-10 Pain Score: 2  Pain Location: L hip Pain Descriptors / Indicators: Aching Pain Intervention(s): Limited activity within patient's tolerance;Monitored during session;Repositioned    Home Living                      Prior Function            PT Goals (current goals can now be found in the care plan section) Acute Rehab PT Goals Patient Stated Goal: back home independent PT Goal Formulation: With patient Time For Goal Achievement: 11/20/14 Potential to Achieve Goals: Good Progress towards PT goals: Progressing toward goals    Frequency  Min 3X/week    PT Plan      Co-evaluation             End of Session Equipment Utilized During Treatment: Gait belt Activity Tolerance: Patient tolerated treatment well;Patient limited by pain Patient left: in chair;with  call bell/phone within reach;with nursing/sitter in room     Time: 0733-0811 PT Time Calculation (min) (ACUTE ONLY): 38 min  Charges:  $Gait Training: 8-22 mins $Therapeutic Activity: 23-37 mins                    G Codes:     Laureen Abrahams, PT, DPT 11/14/2014 9:05 AM  [phone]

## 2014-11-14 NOTE — Discharge Summary (Signed)
Physician Discharge Summary  [name] LZJ:[id number] DOB: 1941-04-23 DOA: 11/12/2014  PCP: Horatio Pel, MD  Admit date: 11/12/2014 Discharge date: 11/14/2014  Recommendations for Outpatient Follow-up:  1. Pt will need to follow up with PCP in 2 weeks post discharge 2. Please obtain BMP and CBC in 1 week   Discharge Diagnoses:  Left displaced femoral neck fracture -Status post left hemiarthroplasty 11/12/2014 -Appreciate orthopedics -PT/OT--> recommended skilled nursing facility -DVT prophylaxis per ortho--Lovenox 40 mg [place] daily x 30 days-->check CBC in one week -if platelets <100K, will need to consider alternative means of DVT prophylaxis Chronic diastolic CHF -Compensated presently Hypothyroidism -Continue Synthroid Depression -Continue Zoloft History of VFib Arrest 2005 -s/p ICD--generator changed 01/18/13 Mild Thrombocytopenia -partly dilutional due to IVF -monitor for now--no bleeding  -CBC in one week  Discharge Condition: stable  Disposition: SNF Follow-up Information    Follow up with NORRIS,STEVEN R, MD. Call in 2 weeks.   Specialty:  Orthopedic Surgery   Why:  [phone]   Contact information:   [address] [place] [address] [phone]       Follow up with [facility].   Why:  hhpt and rolling walker and 3 n 1    Contact information:   [place] [address] [phone]       Follow up with Augustin Schooling, MD. Call in 2 weeks.   Specialty:  Orthopedic Surgery   Why:  For wound re-check   Contact information:   [address] [place] [address] [phone]       Diet:regular Wt Readings from Last 3 Encounters:  11/12/14 69.627 kg (153 lb 8 oz)  04/10/14 67.586 kg (149 lb)  04/04/13 64.864 kg (143 lb)    History of present illness:  74 yo female with history of hypothyroidism and CHF, diastolic CHF, Vfib arrest s/p ICD placement, presented  to [facility] ED after an episode of fall at home. Pt denies similar events in the past, no chest pain or sob or dizziness prior to the event, no shortness of breath. The patient denied any syncope. After the fall, pt reports intermittent left hip pain, throbbing and sharp, 10/10 in severity when present, worse with movement and with no specific alleviating factors. In the emergency department, x-ray revealed a left femoral neck fracture. Orthopedics was consulted. Dr. Esmond Plants saw the patient. The patient underwent a left hemiarthroplasty on 11/12/2014. She participated with physical therapy and occupational therapy both of which recommended skilled nursing facility. The patient was given Lovenox for DVT prophylaxis. He was discharged in stable condition.    Discharge Exam: Filed Vitals:   11/14/14 0447  BP: 130/47  Pulse: 99  Temp: 99.2 F (37.3 C)  Resp: 16   Filed Vitals:   11/13/14 2340 11/13/14 2352 11/14/14 0400 11/14/14 0447  BP: 128/48   130/47  Pulse: 104   99  Temp: 99.1 F (37.3 C)   99.2 F (37.3 C)  TempSrc: Oral   Oral  Resp: 18 20 20 16   Height:      Weight:      SpO2: 91% 92% 100% 100%   General: A&O x 3, NAD, pleasant, cooperative Cardiovascular: RRR, no rub, no gallop, no S3 Respiratory: CTAB, no wheeze, no rhonchi Abdomen:soft, nontender, nondistended, positive bowel sounds Extremities: No edema, No lymphangitis, no petechiae  Discharge Instructions      Discharge Instructions    Diet - low sodium heart healthy  Complete by:  As directed      Increase activity slowly    Complete by:  As directed      Weight bearing as tolerated    Complete by:  As directed             Medication List    TAKE these medications        acetaminophen 325 MG tablet  Commonly known as:  TYLENOL  Take 2 tablets (650 mg total) by mouth every 6 (six) hours as needed.     enoxaparin 40 MG/0.4ML injection  Commonly known as:  LOVENOX  Inject 0.4 mLs (40 mg total) into  the skin daily.     feeding supplement (ENSURE COMPLETE) Liqd  Take 237 mLs by mouth 2 (two) times daily between meals.     fish oil-omega-3 fatty acids 1000 MG capsule  Take 1 g by mouth daily.     gabapentin 300 MG capsule  Commonly known as:  NEURONTIN  Take 300 mg by mouth 2 (two) times daily.     HYDROcodone-acetaminophen 5-325 MG per tablet  Commonly known as:  NORCO/VICODIN  Take 1-2 tablets by mouth every 6 (six) hours as needed for moderate pain.     levothyroxine 112 MCG tablet  Commonly known as:  SYNTHROID, LEVOTHROID  Take 112 mcg by mouth daily before breakfast.     multivitamin with minerals tablet  Take 1 tablet by mouth daily.     sertraline 100 MG tablet  Commonly known as:  ZOLOFT  Take 100 mg by mouth daily.         The results of significant diagnostics from this hospitalization (including imaging, microbiology, ancillary and laboratory) are listed below for reference.    Significant Diagnostic Studies: Dg Chest 1 View  11/12/2014   CLINICAL DATA:  Tripped and fell.  Left hip pain.  EXAM: CHEST - 1 VIEW  COMPARISON:  01/19/2013  FINDINGS: The cardiac silhouette, mediastinal and hilar contours are within normal limits and stable. The pacer wires/ AICD are stable. No acute pulmonary findings. No pleural effusion. The bony thorax is intact.  IMPRESSION: No acute cardiopulmonary findings.   Electronically Signed   By: Kalman Jewels M.D.   On: 11/12/2014 16:15   Pelvis Portable  11/13/2014   CLINICAL DATA:  Postop left hip.  EXAM: PORTABLE PELVIS 1-2 VIEWS  COMPARISON:  Pelvis radiography from earlier the same day.  FINDINGS: New bipolar left hip hemiarthroplasty. No dislocation or periprosthetic fracture. The imaged right hip is intact and located.  IMPRESSION: New left hip hemiarthroplasty.  No adverse findings.   Electronically Signed   By: Jorje Guild M.D.   On: 11/13/2014 00:38   Dg Hip Unilat With Pelvis 2-3 Views Left  11/12/2014   CLINICAL DATA:   Tripped in driveway yesterday with persistent left hip pain, initial encounter  EXAM: DG HIP W/ PELVIS 2-3V*L*  COMPARISON:  None.  FINDINGS: There are changes consistent with a subcapital femoral neck fracture with some impaction and angulation at the fracture site. No other bony abnormality is seen.  IMPRESSION: Subcapital left femoral neck fracture.   Electronically Signed   By: Inez Catalina M.D.   On: 11/12/2014 16:11     Microbiology: Recent Results (from the past 240 hour(s))  Surgical pcr screen     Status: None   Collection Time: 11/12/14  7:01 PM  Result Value Ref Range Status   MRSA, PCR NEGATIVE NEGATIVE Final   Staphylococcus aureus NEGATIVE NEGATIVE  Final    Comment:        The Xpert SA Assay (FDA approved for NASAL specimens in patients over 64 years of age), is one component of a comprehensive surveillance program.  Test performance has been validated by [facility] for patients greater than or equal to 31 year old. It is not intended to diagnose infection nor to guide or monitor treatment.      Labs: Basic Metabolic Panel:  Recent Labs Lab 11/12/14 1533 11/13/14 0533 11/14/14 0627  NA 138 138 136  K 3.7 3.6 3.5  CL 102 103 101  CO2 28 26 31   GLUCOSE 108* 122* 124*  BUN 14 11 7   CREATININE 0.75 0.71 0.59  CALCIUM 9.4 8.4 8.4   Liver Function Tests: No results for input(s): AST, ALT, ALKPHOS, BILITOT, PROT, ALBUMIN in the last 168 hours. No results for input(s): LIPASE, AMYLASE in the last 168 hours. No results for input(s): AMMONIA in the last 168 hours. CBC:  Recent Labs Lab 11/12/14 1533 11/13/14 0533 11/14/14 0627  WBC 10.0 8.7 6.6  NEUTROABS 8.4*  --   --   HGB 14.1 12.8 11.5*  HCT 42.0 38.6 34.5*  MCV 92.7 94.8 94.5  PLT 179 142* 120*   Cardiac Enzymes: No results for input(s): CKTOTAL, CKMB, CKMBINDEX, TROPONINI in the last 168 hours. BNP: Invalid input(s): POCBNP CBG: No results for input(s): GLUCAP in the last 168 hours.  Time  coordinating discharge:  Greater than 30 minutes  Signed:  Demetruis Depaul, DO [facility] Pager: [phone] 11/14/2014, 8:19 AM

## 2014-11-15 DIAGNOSIS — D696 Thrombocytopenia, unspecified: Secondary | ICD-10-CM

## 2014-11-15 LAB — BASIC METABOLIC PANEL
ANION GAP: 7 (ref 5–15)
BUN: 5 mg/dL — ABNORMAL LOW (ref 6–23)
CHLORIDE: 102 mmol/L (ref 96–112)
CO2: 30 mmol/L (ref 19–32)
Calcium: 8.7 mg/dL (ref 8.4–10.5)
Creatinine, Ser: 0.59 mg/dL (ref 0.50–1.10)
GFR calc Af Amer: >90 mL/min (ref >90)
GFR calc non Af Amer: 89 mL/min — ABNORMAL LOW (ref >90)
Glucose, Bld: 118 mg/dL — ABNORMAL HIGH (ref 70–99)
POTASSIUM: 3.5 mmol/L (ref 3.5–5.1)
Sodium: 139 mmol/L (ref 135–145)

## 2014-11-15 LAB — CBC
HCT: 34 % — ABNORMAL LOW (ref 36.0–46.0)
HEMOGLOBIN: 11.3 g/dL — AB (ref 12.0–15.0)
MCH: 30.3 pg (ref 26.0–34.0)
MCHC: 33.2 g/dL (ref 30.0–36.0)
MCV: 91.2 fL (ref 78.0–100.0)
PLATELETS: 154 10*3/uL (ref 150–400)
RBC: 3.73 MIL/uL — ABNORMAL LOW (ref 3.87–5.11)
RDW: 12.3 % (ref 11.5–15.5)
WBC: 6.8 10*3/uL (ref 4.0–10.5)

## 2014-11-15 MED ORDER — DSS 100 MG PO CAPS: 100.0000 mg | ORAL_CAPSULE | Freq: Two times a day (BID) | ORAL | Status: DC

## 2014-11-15 MED ORDER — SENNA 8.6 MG PO TABS: 2.0000 | ORAL_TABLET | Freq: Every day | ORAL | Status: DC

## 2014-11-15 MED ORDER — SENNA 8.6 MG PO TABS
2.0000 | ORAL_TABLET | Freq: Every day | ORAL | Status: DC
  Administered 2014-11-15 – 2014-11-16 (×2): 17.2 mg via ORAL
  Filled 2014-11-15 (×4): qty 2

## 2014-11-15 MED ORDER — DOCUSATE SODIUM 100 MG PO CAPS
100.0000 mg | ORAL_CAPSULE | Freq: Two times a day (BID) | ORAL | Status: DC
  Administered 2014-11-15 – 2014-11-16 (×4): 100 mg via ORAL
  Filled 2014-11-15 (×5): qty 1

## 2014-11-15 MED ORDER — SODIUM CHLORIDE 0.9 % IV BOLUS (SEPSIS)
500.0000 mL | Freq: Once | INTRAVENOUS | Status: AC
  Administered 2014-11-15: 500 mL via INTRAVENOUS

## 2014-11-15 NOTE — Progress Notes (Signed)
[name]:[id number] DOB: 12/17/1940 DOA: 11/12/2014 PCP: Horatio Pel, MD  Assessment/Plan: Left displaced femoral neck fracture -Status post left hemiarthroplasty 11/12/2014 -Appreciate orthopedics -PT/OT--> recommended skilled nursing facility -DVT prophylaxis per ortho--Lovenox 40 mg [place] daily x 30 days-->check CBC in one week -if platelets <100K, will need to consider alternative means of DVT prophylaxis Chronic diastolic CHF -Compensated presently Hypothyroidism -Continue Synthroid Depression -Continue Zoloft History of VFib Arrest 2005 -s/p ICD--generator changed 01/18/13 Mild Thrombocytopenia -partly dilutional due to IVF -monitor for now--no bleeding  -CBC in one week   Family Communication:   Pt at beside Disposition Plan:   SNF       Procedures/Studies: Dg Chest 1 View  11/12/2014   CLINICAL DATA:  Tripped and fell.  Left hip pain.  EXAM: CHEST - 1 VIEW  COMPARISON:  01/19/2013  FINDINGS: The cardiac silhouette, mediastinal and hilar contours are within normal limits and stable. The pacer wires/ AICD are stable. No acute pulmonary findings. No pleural effusion. The bony thorax is intact.  IMPRESSION: No acute cardiopulmonary findings.   Electronically Signed   By: Kalman Jewels M.D.   On: 11/12/2014 16:15   Pelvis Portable  11/13/2014   CLINICAL DATA:  Postop left hip.  EXAM: PORTABLE PELVIS 1-2 VIEWS  COMPARISON:  Pelvis radiography from earlier the same day.  FINDINGS: New bipolar left hip hemiarthroplasty. No dislocation or periprosthetic fracture. The imaged right hip is intact and located.  IMPRESSION: New left hip hemiarthroplasty.  No adverse findings.   Electronically Signed   By: Jorje Guild M.D.   On: 11/13/2014 00:38   Dg Hip Unilat With Pelvis 2-3 Views Left  11/12/2014   CLINICAL DATA:  Tripped in driveway yesterday with persistent left hip pain, initial encounter  EXAM: DG HIP W/ PELVIS 2-3V*L*   COMPARISON:  None.  FINDINGS: There are changes consistent with a subcapital femoral neck fracture with some impaction and angulation at the fracture site. No other bony abnormality is seen.  IMPRESSION: Subcapital left femoral neck fracture.   Electronically Signed   By: Inez Catalina M.D.   On: 11/12/2014 16:11         Subjective: Patient denies fevers, chills, headache, chest pain, dyspnea, nausea, vomiting, diarrhea, abdominal pain, dysuria, hematuria   Objective: Filed Vitals:   11/15/14 0000 11/15/14 0225 11/15/14 0400 11/15/14 0513  BP:  137/61  127/58  Pulse:  103  106  Temp:  99.8 F (37.7 C)  98.5 F (36.9 C)  TempSrc:  Oral  Oral  Resp: 16 15 16 16   Height:      Weight:      SpO2:  97%  95%    Intake/Output Summary (Last 24 hours) at 11/15/14 1105 Last data filed at 11/14/14 1945  Gross per 24 hour  Intake    240 ml  Output    400 ml  Net   -160 ml   Weight change:  Exam:   General:  Pt is alert, follows commands appropriately, not in acute distress  HEENT: No icterus, No thrush, [place]/AT  Cardiovascular: RRR, S1/S2, no rubs, no gallops  Respiratory: CTA bilaterally, no wheezing, no crackles, no rhonchi  Abdomen: Soft/+BS, non tender, non distended, no guarding  Extremities: No edema, No lymphangitis, No petechiae, No rashes, no synovitis  Data Reviewed: Basic Metabolic Panel:  Recent Labs Lab 11/12/14 1533 11/13/14 0533 11/14/14 0627 11/15/14 0504  NA 138 138 136  139  K 3.7 3.6 3.5 3.5  CL 102 103 101 102  CO2 28 26 31 30   GLUCOSE 108* 122* 124* 118*  BUN 14 11 7  5*  CREATININE 0.75 0.71 0.59 0.59  CALCIUM 9.4 8.4 8.4 8.7   Liver Function Tests: No results for input(s): AST, ALT, ALKPHOS, BILITOT, PROT, ALBUMIN in the last 168 hours. No results for input(s): LIPASE, AMYLASE in the last 168 hours. No results for input(s): AMMONIA in the last 168 hours. CBC:  Recent Labs Lab 11/12/14 1533 11/13/14 0533 11/14/14 0627 11/15/14 0504    WBC 10.0 8.7 6.6 6.8  NEUTROABS 8.4*  --   --   --   HGB 14.1 12.8 11.5* 11.3*  HCT 42.0 38.6 34.5* 34.0*  MCV 92.7 94.8 94.5 91.2  PLT 179 142* 120* 154   Cardiac Enzymes: No results for input(s): CKTOTAL, CKMB, CKMBINDEX, TROPONINI in the last 168 hours. BNP: Invalid input(s): POCBNP CBG: No results for input(s): GLUCAP in the last 168 hours.  Recent Results (from the past 240 hour(s))  Surgical pcr screen     Status: None   Collection Time: 11/12/14  7:01 PM  Result Value Ref Range Status   MRSA, PCR NEGATIVE NEGATIVE Final   Staphylococcus aureus NEGATIVE NEGATIVE Final    Comment:        The Xpert SA Assay (FDA approved for NASAL specimens in patients over 72 years of age), is one component of a comprehensive surveillance program.  Test performance has been validated by [facility] for patients greater than or equal to 107 year old. It is not intended to diagnose infection nor to guide or monitor treatment.      Scheduled Meds: . enoxaparin (LOVENOX) injection  40 mg Subcutaneous Q24H  . feeding supplement (ENSURE COMPLETE)  237 mL Oral BID BM  . ferrous sulfate  325 mg Oral TID PC  . levothyroxine  112 mcg Oral QAC breakfast  . sertraline  100 mg Oral Daily   Continuous Infusions:    Kristiane Morsch, DO  [facility] Pager [phone]  If 7PM-7AM, please contact night-coverage [email or web address] Password [email or web address] 11/15/2014, 11:05 AM   LOS: 3 days

## 2014-11-16 DIAGNOSIS — D696 Thrombocytopenia, unspecified: Secondary | ICD-10-CM

## 2014-11-16 LAB — BASIC METABOLIC PANEL
ANION GAP: 11 (ref 5–15)
BUN: 12 mg/dL (ref 6–23)
CALCIUM: 8.3 mg/dL — AB (ref 8.4–10.5)
CHLORIDE: 102 mmol/L (ref 96–112)
CO2: 25 mmol/L (ref 19–32)
CREATININE: 0.64 mg/dL (ref 0.50–1.10)
GFR, EST NON AFRICAN AMERICAN: 87 mL/min — AB (ref >90)
GLUCOSE: 113 mg/dL — AB (ref 70–99)
Potassium: 3.2 mmol/L — ABNORMAL LOW (ref 3.5–5.1)
Sodium: 138 mmol/L (ref 135–145)

## 2014-11-16 MED ORDER — POTASSIUM CHLORIDE CRYS ER 20 MEQ PO TBCR
40.0000 meq | EXTENDED_RELEASE_TABLET | Freq: Once | ORAL | Status: AC
  Administered 2014-11-16: 40 meq via ORAL
  Filled 2014-11-16: qty 2

## 2014-11-16 NOTE — Discharge Summary (Addendum)
Physician Discharge Summary  [name] RXV:[id number] DOB: 23-Jan-1941 DOA: 11/12/2014  PCP: Horatio Pel, MD  Admit date: 11/12/2014 Discharge date: 11/17/14 Recommendations for Outpatient Follow-up:  1. Pt will need to follow up with PCP in 2 weeks post discharge 2. Please obtain BMP to evaluate electrolytes and kidney function 3. Please also check CBC to evaluate Hg and Hct levels Discharge Diagnoses:  Left displaced femoral neck fracture -Status post left hemiarthroplasty 11/12/2014 -Appreciate orthopedics -PT/OT--> recommended skilled nursing facility -DVT prophylaxis per ortho--Lovenox 40 mg [place] daily x 30 days-->check CBC in one week -if platelets <100K, will need to consider alternative means of DVT prophylaxis Chronic diastolic CHF -Compensated presently Hypothyroidism -Continue Synthroid Depression -Continue Zoloft History of VFib Arrest 2005 -s/p ICD--generator changed 01/18/13 -remained in sinus Mild Thrombocytopenia -partly dilutional due to IVF -monitor for now--no bleeding  -stableand improving -CBC in one week  Discharge Condition: stable  Disposition: SNF Follow-up Information    Follow up with NORRIS,STEVEN R, MD. Call in 2 weeks.   Specialty:  Orthopedic Surgery   Why:  [phone]   Contact information:   [address] [place] [address] [phone]       Follow up with [facility].   Why:  hhpt and rolling walker and 3 n 1    Contact information:   [place] [address] [phone]       Follow up with Augustin Schooling, MD. Call in 2 weeks.   Specialty:  Orthopedic Surgery   Why:  For wound re-check   Contact information:   [address] [place] [address] [phone]       Diet:regular Wt Readings from Last 3 Encounters:  11/12/14 69.627 kg (153 lb 8 oz)  04/10/14 67.586 kg (149 lb)  04/04/13 64.864 kg (143 lb)    History of present  illness:  74 yo female with history of hypothyroidism and CHF, diastolic CHF, Vfib arrest s/p ICD placement, presented to [facility] ED after an episode of fall at home. Pt denies similar events in the past, no chest pain or sob or dizziness prior to the event, no shortness of breath. The patient denied any syncope. After the fall, pt reports intermittent left hip pain, throbbing and sharp, 10/10 in severity when present, worse with movement and with no specific alleviating factors. In the emergency department, x-ray revealed a left femoral neck fracture. Orthopedics was consulted. Dr. Esmond Plants saw the patient. The patient underwent a left hemiarthroplasty on 11/12/2014. She participated with physical therapy and occupational therapy both of which recommended skilled nursing facility. The patient was given Lovenox for DVT prophylaxis. She was discharged in stable condition.    Consultants: Ortho--Dr. Veverly Fells  Discharge Exam: Filed Vitals:   11/16/14 1101  BP:   Pulse:   Temp:   Resp: 17   Filed Vitals:   11/16/14 0032 11/16/14 0356 11/16/14 0758 11/16/14 1101  BP: 146/68 148/66    Pulse: 115 108    Temp: 99.5 F (37.5 C) 97.7 F (36.5 C)    TempSrc: Oral Oral    Resp: 18 18 18 17   Height:      Weight:      SpO2: 94% 92%     General: A&O x 3, NAD, pleasant, cooperative Cardiovascular: RRR, no rub, no gallop, no S3 Respiratory: CTAB, no wheeze, no rhonchi Abdomen:soft, nontender, nondistended, positive bowel sounds Extremities: No edema, No lymphangitis, no petechiae  Discharge Instructions  Discharge Instructions    Diet - low sodium heart healthy    Complete by:  As directed      Increase activity slowly    Complete by:  As directed      Weight bearing as tolerated    Complete by:  As directed             Medication List    TAKE these medications        acetaminophen 325 MG tablet  Commonly known as:  TYLENOL  Take 2 tablets (650 mg total) by mouth every 6  (six) hours as needed.     DSS 100 MG Caps  Take 100 mg by mouth 2 (two) times daily.     enoxaparin 40 MG/0.4ML injection  Commonly known as:  LOVENOX  Inject 0.4 mLs (40 mg total) into the skin daily.     feeding supplement (ENSURE COMPLETE) Liqd  Take 237 mLs by mouth 2 (two) times daily between meals.     fish oil-omega-3 fatty acids 1000 MG capsule  Take 1 g by mouth daily.     gabapentin 300 MG capsule  Commonly known as:  NEURONTIN  Take 300 mg by mouth 2 (two) times daily.     HYDROcodone-acetaminophen 5-325 MG per tablet  Commonly known as:  NORCO/VICODIN  Take 1-2 tablets by mouth every 6 (six) hours as needed for moderate pain.     levothyroxine 112 MCG tablet  Commonly known as:  SYNTHROID, LEVOTHROID  Take 112 mcg by mouth daily before breakfast.     multivitamin with minerals tablet  Take 1 tablet by mouth daily.     senna 8.6 MG Tabs tablet  Commonly known as:  SENOKOT  Take 2 tablets (17.2 mg total) by mouth daily.     sertraline 100 MG tablet  Commonly known as:  ZOLOFT  Take 100 mg by mouth daily.         The results of significant diagnostics from this hospitalization (including imaging, microbiology, ancillary and laboratory) are listed below for reference.    Significant Diagnostic Studies: Dg Chest 1 View  11/12/2014   CLINICAL DATA:  Tripped and fell.  Left hip pain.  EXAM: CHEST - 1 VIEW  COMPARISON:  01/19/2013  FINDINGS: The cardiac silhouette, mediastinal and hilar contours are within normal limits and stable. The pacer wires/ AICD are stable. No acute pulmonary findings. No pleural effusion. The bony thorax is intact.  IMPRESSION: No acute cardiopulmonary findings.   Electronically Signed   By: Kalman Jewels M.D.   On: 11/12/2014 16:15   Pelvis Portable  11/13/2014   CLINICAL DATA:  Postop left hip.  EXAM: PORTABLE PELVIS 1-2 VIEWS  COMPARISON:  Pelvis radiography from earlier the same day.  FINDINGS: New bipolar left hip  hemiarthroplasty. No dislocation or periprosthetic fracture. The imaged right hip is intact and located.  IMPRESSION: New left hip hemiarthroplasty.  No adverse findings.   Electronically Signed   By: Jorje Guild M.D.   On: 11/13/2014 00:38   Dg Hip Unilat With Pelvis 2-3 Views Left  11/12/2014   CLINICAL DATA:  Tripped in driveway yesterday with persistent left hip pain, initial encounter  EXAM: DG HIP W/ PELVIS 2-3V*L*  COMPARISON:  None.  FINDINGS: There are changes consistent with a subcapital femoral neck fracture with some impaction and angulation at the fracture site. No other bony abnormality is seen.  IMPRESSION: Subcapital left femoral neck fracture.   Electronically Signed   By: Elta Guadeloupe  Lukens M.D.   On: 11/12/2014 16:11     Microbiology: Recent Results (from the past 240 hour(s))  Surgical pcr screen     Status: None   Collection Time: 11/12/14  7:01 PM  Result Value Ref Range Status   MRSA, PCR NEGATIVE NEGATIVE Final   Staphylococcus aureus NEGATIVE NEGATIVE Final    Comment:        The Xpert SA Assay (FDA approved for NASAL specimens in patients over 43 years of age), is one component of a comprehensive surveillance program.  Test performance has been validated by [facility] for patients greater than or equal to 22 year old. It is not intended to diagnose infection nor to guide or monitor treatment.      Labs: Basic Metabolic Panel:  Recent Labs Lab 11/12/14 1533 11/13/14 0533 11/14/14 0627 11/15/14 0504 11/16/14 0440  NA 138 138 136 139 138  K 3.7 3.6 3.5 3.5 3.2*  CL 102 103 101 102 102  CO2 28 26 31 30 25   GLUCOSE 108* 122* 124* 118* 113*  BUN 14 11 7  5* 12  CREATININE 0.75 0.71 0.59 0.59 0.64  CALCIUM 9.4 8.4 8.4 8.7 8.3*   Liver Function Tests: No results for input(s): AST, ALT, ALKPHOS, BILITOT, PROT, ALBUMIN in the last 168 hours. No results for input(s): LIPASE, AMYLASE in the last 168 hours. No results for input(s): AMMONIA in the last 168  hours. CBC:  Recent Labs Lab 11/12/14 1533 11/13/14 0533 11/14/14 0627 11/15/14 0504  WBC 10.0 8.7 6.6 6.8  NEUTROABS 8.4*  --   --   --   HGB 14.1 12.8 11.5* 11.3*  HCT 42.0 38.6 34.5* 34.0*  MCV 92.7 94.8 94.5 91.2  PLT 179 142* 120* 154   Cardiac Enzymes: No results for input(s): CKTOTAL, CKMB, CKMBINDEX, TROPONINI in the last 168 hours. BNP: Invalid input(s): POCBNP CBG: No results for input(s): GLUCAP in the last 168 hours.  Time coordinating discharge:  Greater than 30 minutes  Signed:  Jahnay Lantier, DO [facility] Pager: [phone] 11/16/2014, 11:37 AM

## 2014-11-16 NOTE — Social Work (Signed)
CSW presented Bed Offers to patient and patient stated that she would like to go to [facility]. CSW will follow up to coordinate placement.  Christene Lye MSW, [place]

## 2014-11-16 NOTE — Progress Notes (Signed)
   Subjective: 4 Days Post-Op Procedure(s) (LRB): Left Hip Hemiarthroplasty (Left) Patient reports pain as mild.   Patient seen in rounds with Dr. Wynelle Link. Patient is well, and has had no acute complaints or problems Plan is to go Skilled nursing facility after hospital stay.  Objective: Vital signs in last 24 hours: Temp:  [97.7 F (36.5 C)-100 F (37.8 C)] 97.7 F (36.5 C) (01/24 0356) Pulse Rate:  [93-115] 108 (01/24 0356) Resp:  [18-24] 18 (01/24 0758) BP: (124-149)/(52-68) 148/66 mmHg (01/24 0356) SpO2:  [92 %-98 %] 92 % (01/24 0356)  Intake/Output from previous day:  Intake/Output Summary (Last 24 hours) at 11/16/14 0951 Last data filed at 11/15/14 1907  Gross per 24 hour  Intake    480 ml  Output    100 ml  Net    380 ml    Intake/Output this shift:    Labs:  Recent Labs  11/14/14 0627 11/15/14 0504  HGB 11.5* 11.3*    Recent Labs  11/14/14 0627 11/15/14 0504  WBC 6.6 6.8  RBC 3.65* 3.73*  HCT 34.5* 34.0*  PLT 120* 154    Recent Labs  11/15/14 0504 11/16/14 0440  NA 139 138  K 3.5 3.2*  CL 102 102  CO2 30 25  BUN 5* 12  CREATININE 0.59 0.64  GLUCOSE 118* 113*  CALCIUM 8.7 8.3*   No results for input(s): LABPT, INR in the last 72 hours.  EXAM General - Patient is Alert and Appropriate Extremity - Neurovascular intact Sensation intact distally Dressing/Incision - clean, dry, no drainage Motor Function - intact, moving foot and toes well on exam.   Past Medical History  Diagnosis Date  . Arthritis   . Arthritis   . Congestive heart failure   . Cardiac defibrillator in place 2005  . Depression   . Osteoporosis   . Thyroid disease   . Osteoporosis   . Thyroid disease   . Fibromyalgia   . Gallbladder disease     Assessment/Plan: 4 Days Post-Op Procedure(s) (LRB): Left Hip Hemiarthroplasty (Left) Active Problems:   Fall   Hip fracture   Hip fracture requiring operative repair   Depression   Chronic diastolic CHF  (congestive heart failure)   Thrombocytopenia  Estimated body mass index is 27.2 kg/(m^2) as calculated from the following:   Height as of this encounter: 5\' 3"  (1.6 m).   Weight as of this encounter: 69.627 kg (153 lb 8 oz). Discharge to SNF when available  DVT Prophylaxis - Lovenox Weight Bearing As Tolerated left Leg Daily dressing change  Arlee Muslim, PA-C Orthopaedic Surgery 11/16/2014, 9:51 AM

## 2014-11-17 ENCOUNTER — Encounter: Payer: Self-pay | Admitting: Internal Medicine

## 2014-11-17 ENCOUNTER — Encounter (HOSPITAL_COMMUNITY): Payer: Self-pay | Admitting: Orthopedic Surgery

## 2014-11-17 DIAGNOSIS — M6281 Muscle weakness (generalized): Secondary | ICD-10-CM | POA: Diagnosis not present

## 2014-11-17 DIAGNOSIS — S72002D Fracture of unspecified part of neck of left femur, subsequent encounter for closed fracture with routine healing: Secondary | ICD-10-CM | POA: Diagnosis not present

## 2014-11-17 DIAGNOSIS — Z96642 Presence of left artificial hip joint: Secondary | ICD-10-CM | POA: Diagnosis not present

## 2014-11-17 DIAGNOSIS — T148 Other injury of unspecified body region: Secondary | ICD-10-CM | POA: Diagnosis not present

## 2014-11-17 DIAGNOSIS — R600 Localized edema: Secondary | ICD-10-CM | POA: Diagnosis not present

## 2014-11-17 DIAGNOSIS — R601 Generalized edema: Secondary | ICD-10-CM | POA: Diagnosis not present

## 2014-11-17 DIAGNOSIS — Z471 Aftercare following joint replacement surgery: Secondary | ICD-10-CM | POA: Diagnosis not present

## 2014-11-17 DIAGNOSIS — D696 Thrombocytopenia, unspecified: Secondary | ICD-10-CM | POA: Diagnosis not present

## 2014-11-17 DIAGNOSIS — R488 Other symbolic dysfunctions: Secondary | ICD-10-CM | POA: Diagnosis not present

## 2014-11-17 DIAGNOSIS — I4901 Ventricular fibrillation: Secondary | ICD-10-CM | POA: Diagnosis not present

## 2014-11-17 DIAGNOSIS — E038 Other specified hypothyroidism: Secondary | ICD-10-CM | POA: Diagnosis not present

## 2014-11-17 DIAGNOSIS — S9030XA Contusion of unspecified foot, initial encounter: Secondary | ICD-10-CM | POA: Diagnosis not present

## 2014-11-17 DIAGNOSIS — M21252 Flexion deformity, left hip: Secondary | ICD-10-CM | POA: Diagnosis not present

## 2014-11-17 DIAGNOSIS — Z95810 Presence of automatic (implantable) cardiac defibrillator: Secondary | ICD-10-CM | POA: Diagnosis not present

## 2014-11-17 DIAGNOSIS — R262 Difficulty in walking, not elsewhere classified: Secondary | ICD-10-CM | POA: Diagnosis not present

## 2014-11-17 DIAGNOSIS — W1830XD Fall on same level, unspecified, subsequent encounter: Secondary | ICD-10-CM | POA: Diagnosis not present

## 2014-11-17 DIAGNOSIS — I5032 Chronic diastolic (congestive) heart failure: Secondary | ICD-10-CM | POA: Diagnosis not present

## 2014-11-17 DIAGNOSIS — M25552 Pain in left hip: Secondary | ICD-10-CM | POA: Diagnosis not present

## 2014-11-17 DIAGNOSIS — F329 Major depressive disorder, single episode, unspecified: Secondary | ICD-10-CM | POA: Diagnosis not present

## 2014-11-17 LAB — CBC
HEMATOCRIT: 33.9 % — AB (ref 36.0–46.0)
Hemoglobin: 11.4 g/dL — ABNORMAL LOW (ref 12.0–15.0)
MCH: 31.1 pg (ref 26.0–34.0)
MCHC: 33.6 g/dL (ref 30.0–36.0)
MCV: 92.6 fL (ref 78.0–100.0)
PLATELETS: 179 10*3/uL (ref 150–400)
RBC: 3.66 MIL/uL — ABNORMAL LOW (ref 3.87–5.11)
RDW: 12.3 % (ref 11.5–15.5)
WBC: 3.8 10*3/uL — AB (ref 4.0–10.5)

## 2014-11-17 LAB — BASIC METABOLIC PANEL
ANION GAP: 8 (ref 5–15)
BUN: 9 mg/dL (ref 6–23)
CO2: 29 mmol/L (ref 19–32)
Calcium: 8.7 mg/dL (ref 8.4–10.5)
Chloride: 102 mmol/L (ref 96–112)
Creatinine, Ser: 0.59 mg/dL (ref 0.50–1.10)
GFR calc Af Amer: >90 mL/min (ref >90)
GFR calc non Af Amer: 89 mL/min — ABNORMAL LOW (ref >90)
Glucose, Bld: 110 mg/dL — ABNORMAL HIGH (ref 70–99)
Potassium: 4 mmol/L (ref 3.5–5.1)
Sodium: 139 mmol/L (ref 135–145)

## 2014-11-17 LAB — TSH: TSH: 0.174 u[IU]/mL — ABNORMAL LOW (ref 0.350–4.500)

## 2014-11-17 NOTE — Progress Notes (Signed)
PROGRESS NOTE  [name] NOB:[id number] DOB: 07-01-1941 DOA: 11/12/2014 PCP: Horatio Pel, MD  Assessment/Plan: Left displaced femoral neck fracture -Status post left hemiarthroplasty 11/12/2014 -Appreciate orthopedics -PT/OT--> recommended skilled nursing facility -DVT prophylaxis per ortho--Lovenox 40 mg [place] daily x 30 days-->check CBC in one week -if platelets <100K, will need to consider alternative means of DVT prophylaxis Chronic diastolic CHF -Compensated presently Hypothyroidism -Continue Synthroid Depression -Continue Zoloft History of VFib Arrest 2005 -s/p ICD--generator changed 01/18/13 -remained in sinus Mild Thrombocytopenia -partly dilutional due to IVF -monitor for now--no bleeding  -stable and improving -CBC in one week   Family Communication:   Daughter updated at beside Disposition Plan:   SNF       Procedures/Studies: Dg Chest 1 View  11/12/2014   CLINICAL DATA:  Tripped and fell.  Left hip pain.  EXAM: CHEST - 1 VIEW  COMPARISON:  01/19/2013  FINDINGS: The cardiac silhouette, mediastinal and hilar contours are within normal limits and stable. The pacer wires/ AICD are stable. No acute pulmonary findings. No pleural effusion. The bony thorax is intact.  IMPRESSION: No acute cardiopulmonary findings.   Electronically Signed   By: Kalman Jewels M.D.   On: 11/12/2014 16:15   Pelvis Portable  11/13/2014   CLINICAL DATA:  Postop left hip.  EXAM: PORTABLE PELVIS 1-2 VIEWS  COMPARISON:  Pelvis radiography from earlier the same day.  FINDINGS: New bipolar left hip hemiarthroplasty. No dislocation or periprosthetic fracture. The imaged right hip is intact and located.  IMPRESSION: New left hip hemiarthroplasty.  No adverse findings.   Electronically Signed   By: Jorje Guild M.D.   On: 11/13/2014 00:38   Dg Hip Unilat With Pelvis 2-3 Views Left  11/12/2014   CLINICAL DATA:  Tripped in driveway yesterday with persistent left hip pain,  initial encounter  EXAM: DG HIP W/ PELVIS 2-3V*L*  COMPARISON:  None.  FINDINGS: There are changes consistent with a subcapital femoral neck fracture with some impaction and angulation at the fracture site. No other bony abnormality is seen.  IMPRESSION: Subcapital left femoral neck fracture.   Electronically Signed   By: Inez Catalina M.D.   On: 11/12/2014 16:11         Subjective: Patient denies fevers, chills, headache, chest pain, dyspnea, nausea, vomiting, diarrhea, abdominal pain, dysuria, hematuria   Objective: Filed Vitals:   11/16/14 1101 11/16/14 1452 11/16/14 2056 11/17/14 0527  BP:  139/65 142/63 147/59  Pulse:  92 86 74  Temp:  98.1 F (36.7 C) 98.4 F (36.9 C) 98.4 F (36.9 C)  TempSrc:   Oral Oral  Resp: 17 20 18 18   Height:      Weight:      SpO2:  95% 97% 95%    Intake/Output Summary (Last 24 hours) at 11/17/14 1101 Last data filed at 11/17/14 9476  Gross per 24 hour  Intake   1074 ml  Output    650 ml  Net    424 ml   Weight change:  Exam:   General:  Pt is alert, follows commands appropriately, not in acute distress  HEENT: No icterus, No thrush, No neck mass, [place]/AT  Cardiovascular: RRR, S1/S2, no rubs, no gallops  Respiratory: CTA bilaterally, no wheezing, no crackles, no rhonchi  Abdomen: Soft/+BS, non tender, non distended, no guarding  Extremities: No edema, No lymphangitis, No petechiae, No rashes, no synovitis  Data Reviewed: Basic Metabolic Panel:  Recent Labs Lab  11/13/14 0533 11/14/14 6644 11/15/14 0504 11/16/14 0440 11/17/14 0607  NA 138 136 139 138 139  K 3.6 3.5 3.5 3.2* 4.0  CL 103 101 102 102 102  CO2 26 31 30 25 29   GLUCOSE 122* 124* 118* 113* 110*  BUN 11 7 5* 12 9  CREATININE 0.71 0.59 0.59 0.64 0.59  CALCIUM 8.4 8.4 8.7 8.3* 8.7   Liver Function Tests: No results for input(s): AST, ALT, ALKPHOS, BILITOT, PROT, ALBUMIN in the last 168 hours. No results for input(s): LIPASE, AMYLASE in the last 168 hours. No  results for input(s): AMMONIA in the last 168 hours. CBC:  Recent Labs Lab 11/12/14 1533 11/13/14 0533 11/14/14 0627 11/15/14 0504 11/17/14 0607  WBC 10.0 8.7 6.6 6.8 3.8*  NEUTROABS 8.4*  --   --   --   --   HGB 14.1 12.8 11.5* 11.3* 11.4*  HCT 42.0 38.6 34.5* 34.0* 33.9*  MCV 92.7 94.8 94.5 91.2 92.6  PLT 179 142* 120* 154 179   Cardiac Enzymes: No results for input(s): CKTOTAL, CKMB, CKMBINDEX, TROPONINI in the last 168 hours. BNP: Invalid input(s): POCBNP CBG: No results for input(s): GLUCAP in the last 168 hours.  Recent Results (from the past 240 hour(s))  Surgical pcr screen     Status: None   Collection Time: 11/12/14  7:01 PM  Result Value Ref Range Status   MRSA, PCR NEGATIVE NEGATIVE Final   Staphylococcus aureus NEGATIVE NEGATIVE Final    Comment:        The Xpert SA Assay (FDA approved for NASAL specimens in patients over 3 years of age), is one component of a comprehensive surveillance program.  Test performance has been validated by [facility] for patients greater than or equal to 54 year old. It is not intended to diagnose infection nor to guide or monitor treatment.      Scheduled Meds: . docusate sodium  100 mg Oral BID  . enoxaparin (LOVENOX) injection  40 mg Subcutaneous Q24H  . feeding supplement (ENSURE COMPLETE)  237 mL Oral BID BM  . ferrous sulfate  325 mg Oral TID PC  . levothyroxine  112 mcg Oral QAC breakfast  . senna  2 tablet Oral Daily  . sertraline  100 mg Oral Daily   Continuous Infusions:    [name], DO  [facility] Pager [phone]  If 7PM-7AM, please contact night-coverage [email or web address] Password [email or web address] 11/17/2014, 11:01 AM   LOS: 5 days

## 2014-11-17 NOTE — Progress Notes (Signed)
Physical Therapy Treatment Patient Details Name: [name] MRN: [id number] DOB: Apr 27, 1941 Today's Date: 11/17/2014    History of Present Illness Pt is 74 yo female with history of hypothyroidism and CHF, diastolic and systolic CHF, s/p ICD placement, presented to [facility] ED after an episode of fall at home. Pt denies similar events in the past, no chest pain prior to the event, no shortness of breath, no abd or urinary concerns. After the fall, pt reports intermittent left hip pain, throbbing and sharp, 10/10 in severity when present, worse with movement and with no specific alleviating factors    PT Comments    Pt was seen for continuation of therapy to increase gait stability and endurance and demonstrates a safer transfer now.  Her plan is to go to SNF and will go home from there.  Daughter was present to hear her information and to help with transition to SNF.  Follow Up Recommendations  SNF;Supervision/Assistance - 24 hour     Equipment Recommendations  Rolling walker with 5" wheels;3in1 (PT)    Recommendations for Other Services       Precautions / Restrictions Precautions Precautions: Fall Restrictions Weight Bearing Restrictions: No LLE Weight Bearing: Weight bearing as tolerated    Mobility  Bed Mobility               General bed mobility comments: up when PT entered  Transfers Overall transfer level: Needs assistance Equipment used: Rolling walker (2 wheeled) Transfers: Sit to/from [facility] Sit to Stand: Min guard Stand pivot transfers: Min guard;Min assist       General transfer comment: Sequencing was improved and pt cannot name precautions but does observe with movement  Ambulation/Gait Ambulation/Gait assistance: Min guard Ambulation Distance (Feet): 100 Feet Assistive device: Rolling walker (2 wheeled) Gait Pattern/deviations: Step-through pattern;Decreased dorsiflexion - right;Decreased dorsiflexion - left;Shuffle;Wide base  of support;Drifts right/left Gait velocity: reduced Gait velocity interpretation: Below normal speed for age/gender General Gait Details: has a slow pace and tends to let walker get too far away, too close to R side objects that need clearance   Stairs            Wheelchair Mobility    Modified Rankin (Stroke Patients Only)       Balance Overall balance assessment: Needs assistance Sitting-balance support: Bilateral upper extremity supported;Feet supported Sitting balance-Leahy Scale: Good Sitting balance - Comments: sitting more evenly today Postural control: Posterior lean Standing balance support: Bilateral upper extremity supported Standing balance-Leahy Scale: Fair Standing balance comment: RW lending stability for use of hand at times                    Cognition Arousal/Alertness: Awake/alert Behavior During Therapy: WFL for tasks assessed/performed Overall Cognitive Status: Within Functional Limits for tasks assessed                      Exercises      General Comments General comments (skin integrity, edema, etc.): Pt is somewhat reluctant about moving but more confident after getting up and going. Has plan to get to SNF       Pertinent Vitals/Pain Pain Assessment: Faces Faces Pain Scale: Hurts little more Pain Location: L hip and Shoulders Pain Descriptors / Indicators: Aching Pain Intervention(s): Limited activity within patient's tolerance;Monitored during session;Premedicated before session;Repositioned    Home Living                      Prior  Function            PT Goals (current goals can now be found in the care plan section) Acute Rehab PT Goals Patient Stated Goal: back home independent PT Goal Formulation: With patient Progress towards PT goals: Progressing toward goals    Frequency  Min 3X/week    PT Plan Current plan remains appropriate    Co-evaluation             End of Session Equipment  Utilized During Treatment: Gait belt Activity Tolerance: Patient tolerated treatment well;Patient limited by pain Patient left: in chair;with call bell/phone within reach;with nursing/sitter in room     Time: 1047-1108 PT Time Calculation (min) (ACUTE ONLY): 21 min  Charges:  $Gait Training: 8-22 mins $Therapeutic Activity: 8-22 mins                    G Codes:      Ramond Dial 11/21/2014, 12:10 PM  Mee Hives, PT MS Acute Rehab Dept. Number: [phone]

## 2014-11-17 NOTE — Clinical Social Work Placement (Signed)
Clinical Social Work Department CLINICAL SOCIAL WORK PLACEMENT NOTE 11/17/2014  Patient:  [name], [name]  Account Number:  [id number] Admit date:  11/12/2014  Clinical Social Worker:  Kemper Durie, [place]  Date/time:  11/17/2014 11:40 AM  Clinical Social Work is seeking post-discharge placement for this patient at the following level of care:   [place]   (*CSW will update this form in Epic as items are completed)   11/14/2014  Patient/family provided with [place] Department of Clinical Social Work's list of facilities offering this level of care within the geographic area requested by the patient (or if unable, by the patient's family).  11/14/2014  Patient/family informed of their freedom to choose among providers that offer the needed level of care, that participate in Medicare, Medicaid or managed care program needed by the patient, have an available bed and are willing to accept the patient.  11/14/2014  Patient/family informed of [facility]' ownership interest in [facility], as well as of the fact that they are under no obligation to receive care at this facility.  PASARR submitted to EDS on 11/14/2014 PASARR number received on 11/14/2014  FL2 transmitted to all facilities in geographic area requested by pt/family on  11/14/2014 FL2 transmitted to all facilities within larger geographic area on   Patient informed that his/her managed care company has contracts with or will negotiate with  certain facilities, including the following:     Patient/family informed of bed offers received:  11/16/2014 Patient chooses bed at [facility] Physician recommends and patient chooses bed at    Patient to be transferred to [facility] on  11/17/2014 Patient to be transferred to facility by Ambulance Patient and family notified of transfer on 11/17/2014 Name of family member notified:  Cecille Rubin and Barnetta Chapel  The  following physician request were entered in Epic:   Additional Comments:    Per MD patient ready for DC to [facility]. RN, patient, patient's family, and facility notified of DC. RN given number for report. DC packet on chart. [facility] transport requested for patient for 12:30PM. CSW signing off.    Liz Beach MSW, [place], [place], [phone]

## 2014-11-17 NOTE — Progress Notes (Signed)
[name] to be D/C'd Home per MD order.  Discussed with the patient and all questions fully answered.    Medication List    TAKE these medications        acetaminophen 325 MG tablet  Commonly known as:  TYLENOL  Take 2 tablets (650 mg total) by mouth every 6 (six) hours as needed.     DSS 100 MG Caps  Take 100 mg by mouth 2 (two) times daily.     enoxaparin 40 MG/0.4ML injection  Commonly known as:  LOVENOX  Inject 0.4 mLs (40 mg total) into the skin daily.     feeding supplement (ENSURE COMPLETE) Liqd  Take 237 mLs by mouth 2 (two) times daily between meals.     fish oil-omega-3 fatty acids 1000 MG capsule  Take 1 g by mouth daily.     gabapentin 300 MG capsule  Commonly known as:  NEURONTIN  Take 300 mg by mouth 2 (two) times daily.     HYDROcodone-acetaminophen 5-325 MG per tablet  Commonly known as:  NORCO/VICODIN  Take 1-2 tablets by mouth every 6 (six) hours as needed for moderate pain.     levothyroxine 112 MCG tablet  Commonly known as:  SYNTHROID, LEVOTHROID  Take 112 mcg by mouth daily before breakfast.     multivitamin with minerals tablet  Take 1 tablet by mouth daily.     senna 8.6 MG Tabs tablet  Commonly known as:  SENOKOT  Take 2 tablets (17.2 mg total) by mouth daily.     sertraline 100 MG tablet  Commonly known as:  ZOLOFT  Take 100 mg by mouth daily.        VVS. L hip dressing clean and intact.  IV catheter discontinued intact. Site without signs and symptoms of complications. Dressing and pressure applied. Pt d/c to [facility] and transferred via EMS.    [name] 11/17/2014 11:44 AM

## 2014-11-17 NOTE — Progress Notes (Signed)
Report given to Randell Patient, Nurse at [facility]

## 2014-11-18 ENCOUNTER — Non-Acute Institutional Stay (SKILLED_NURSING_FACILITY): Payer: Medicare Other | Admitting: Internal Medicine

## 2014-11-18 DIAGNOSIS — I4901 Ventricular fibrillation: Secondary | ICD-10-CM

## 2014-11-18 DIAGNOSIS — I5032 Chronic diastolic (congestive) heart failure: Secondary | ICD-10-CM | POA: Diagnosis not present

## 2014-11-18 DIAGNOSIS — F32A Depression, unspecified: Secondary | ICD-10-CM

## 2014-11-18 DIAGNOSIS — S72002D Fracture of unspecified part of neck of left femur, subsequent encounter for closed fracture with routine healing: Secondary | ICD-10-CM | POA: Diagnosis not present

## 2014-11-18 DIAGNOSIS — E038 Other specified hypothyroidism: Secondary | ICD-10-CM

## 2014-11-18 DIAGNOSIS — Z95810 Presence of automatic (implantable) cardiac defibrillator: Secondary | ICD-10-CM

## 2014-11-18 DIAGNOSIS — F329 Major depressive disorder, single episode, unspecified: Secondary | ICD-10-CM | POA: Diagnosis not present

## 2014-11-18 DIAGNOSIS — D696 Thrombocytopenia, unspecified: Secondary | ICD-10-CM

## 2014-11-18 DIAGNOSIS — E034 Atrophy of thyroid (acquired): Secondary | ICD-10-CM

## 2014-11-18 NOTE — Progress Notes (Signed)
MRN: [id number] Name: [name]  Sex: female Age: 74 y.o. DOB: 11-01-1940  [place] #: [facility] Facility/Room:108 Level Of Care: SNF Provider: Inocencio Homes D Emergency Contacts: Extended Emergency Contact Information Primary Emergency Contact: Seymour,Larry Address: [place], [place] [address] [place] Phone: [phone] Work Phone: [phone] Relation: Spouse Secondary Emergency Contact: Gerome Sam [place] Phone: [phone] Mobile Phone: [phone] Relation: Daughter     Allergies: Sulfonamide derivatives  Chief Complaint  Patient presents with  . New Admit To SNF    HPI: Patient is 74 y.o. female who is admitted to SNF for OT/PT 2/2 hip repair 2/2 fall with fracture.  Past Medical History  Diagnosis Date  . Arthritis   . Arthritis   . Congestive heart failure   . Cardiac defibrillator in place 2005  . Depression   . Osteoporosis   . Thyroid disease   . Osteoporosis   . Thyroid disease   . Fibromyalgia   . Gallbladder disease     Past Surgical History  Procedure Laterality Date  . Colonoscopy    . Polypectomy    . Colonoscopy  2010    Adenomatous with high grade dysplasia  . Cardiac catheterization  2005  . Cardiac defibrillator placement  12-23-2003    MDT single chamber ICD implanted by Dr Caryl Comes [phone] lead)  . Implantable cardioverter defibrillator generator change  01-18-2013    MDT ICD gen change with insertion of new RV lead, capping of 6949 lead by Dr Lovena Le  . Implantable cardioverter defibrillator generator change N/A 01/18/2013    Procedure: IMPLANTABLE CARDIOVERTER DEFIBRILLATOR GENERATOR CHANGE;  Surgeon: Evans Lance, MD;  Location: [facility] CATH LAB;  Service: Cardiovascular;  Laterality: N/A;  . Lead revision N/A 01/18/2013    Procedure: LEAD REVISION;  Surgeon: Evans Lance, MD;  Location: [facility] CATH LAB;  Service: Cardiovascular;  Laterality: N/A;  . Hip arthroplasty Left  11/12/2014    Procedure: Left Hip Hemiarthroplasty;  Surgeon: Augustin Schooling, MD;  Location: [place];  Service: Orthopedics;  Laterality: Left;      Medication List       This list is accurate as of: 11/18/14 11:59 PM.  Always use your most recent med list.               acetaminophen 325 MG tablet  Commonly known as:  TYLENOL  Take 2 tablets (650 mg total) by mouth every 6 (six) hours as needed.     DSS 100 MG Caps  Take 100 mg by mouth 2 (two) times daily.     enoxaparin 40 MG/0.4ML injection  Commonly known as:  LOVENOX  Inject 0.4 mLs (40 mg total) into the skin daily.     feeding supplement (ENSURE COMPLETE) Liqd  Take 237 mLs by mouth 2 (two) times daily between meals.     fish oil-omega-3 fatty acids 1000 MG capsule  Take 1 g by mouth daily.     gabapentin 300 MG capsule  Commonly known as:  NEURONTIN  Take 300 mg by mouth 2 (two) times daily.     HYDROcodone-acetaminophen 5-325 MG per tablet  Commonly known as:  NORCO/VICODIN  Take 1-2 tablets by mouth every 6 (six) hours as needed for moderate pain.     levothyroxine 112 MCG tablet  Commonly known as:  SYNTHROID, LEVOTHROID  Take 112 mcg by mouth daily before breakfast.  multivitamin with minerals tablet  Take 1 tablet by mouth daily.     senna 8.6 MG Tabs tablet  Commonly known as:  SENOKOT  Take 2 tablets (17.2 mg total) by mouth daily.     sertraline 100 MG tablet  Commonly known as:  ZOLOFT  Take 100 mg by mouth daily.        No orders of the defined types were placed in this encounter.    Immunization History  Administered Date(s) Administered  . Influenza-Unspecified 06/24/2013    History  Substance Use Topics  . Smoking status: Former Smoker    Quit date: 11/22/2006  . Smokeless tobacco: Never Used  . Alcohol Use: No    Family history is noncontributory    Review of Systems  DATA OBTAINED: from patient GENERAL:  no fevers, fatigue, appetite changes SKIN: No itching, rash  or wounds EYES: No eye pain, redness, discharge EARS: No earache, tinnitus, change in hearing NOSE: No congestion, drainage or bleeding  MOUTH/THROAT: No mouth or tooth pain, No sore throat RESPIRATORY: No cough, wheezing, SOB CARDIAC: No chest pain, palpitations, lower extremity edema  GI: No abdominal pain, No N/V/D or constipation, No heartburn or reflux  GU: No dysuria, frequency or urgency, or incontinence  MUSCULOSKELETAL: No unrelieved bone/joint pain NEUROLOGIC: No headache, dizziness or focal weakness PSYCHIATRIC: No overt anxiety or sadness, No behavior issue.   Filed Vitals:   11/18/14 2107  BP: 126/66  Pulse: 89  Temp: 96.3 F (35.7 C)  Resp: 18    Physical Exam  GENERAL APPEARANCE: Alert, conversant,  No acute distress.  SKIN: No diaphoresis rash; incision area without heat or swelling HEAD: Normocephalic, atraumatic  EYES: Conjunctiva/lids clear. Pupils round, reactive. EOMs intact.  EARS: External exam WNL, canals clear. Hearing grossly normal.  NOSE: No deformity or discharge.  MOUTH/THROAT: Lips w/o lesions  RESPIRATORY: Breathing is even, unlabored. Lung sounds are clear   CARDIOVASCULAR: Heart RRR no murmurs, rubs or gallops. No peripheral edema.   GASTROINTESTINAL: Abdomen is soft, non-tender, not distended w/ normal bowel sounds. GENITOURINARY: Bladder non tender, not distended  MUSCULOSKELETAL: No abnormal joints or musculature NEUROLOGIC:  Cranial nerves 2-12 grossly intact. Moves all extremities  PSYCHIATRIC: Mood and affect appropriate to situation, no behavioral issues  Patient Active Problem List   Diagnosis Date Noted  . Hypothyroidism 11/20/2014  . Thrombocytopenia 11/15/2014  . Hip fracture requiring operative repair 11/13/2014  . Depression 11/13/2014  . Chronic diastolic CHF (congestive heart failure) 11/13/2014  . Hip fracture 11/12/2014  . Fall   . Ventricular fibrillation 12/15/2009  . Automatic implantable cardioverter-defibrillator  in situ 12/15/2009  . FIBROMYALGIA 11/02/2009    CBC    Component Value Date/Time   WBC 3.8* 11/17/2014 0607   RBC 3.66* 11/17/2014 0607   HGB 11.4* 11/17/2014 0607   HCT 33.9* 11/17/2014 0607   PLT 179 11/17/2014 0607   MCV 92.6 11/17/2014 0607   LYMPHSABS 0.6* 11/12/2014 1533   MONOABS 1.0 11/12/2014 1533   EOSABS 0.0 11/12/2014 1533   BASOSABS 0.0 11/12/2014 1533    CMP     Component Value Date/Time   NA 139 11/17/2014 0607   K 4.0 11/17/2014 0607   CL 102 11/17/2014 0607   CO2 29 11/17/2014 0607   GLUCOSE 110* 11/17/2014 0607   BUN 9 11/17/2014 0607   CREATININE 0.59 11/17/2014 0607   CALCIUM 8.7 11/17/2014 0607   GFRNONAA 89* 11/17/2014 0607   GFRAA >90 11/17/2014 0607    Assessment and  Plan  Hip fracture requiring operative repair Left displaced femoral neck fracture -Status post left hemiarthroplasty 11/12/2014 -Appreciate orthopedics -PT/OT--> recommended skilled nursing facility -DVT prophylaxis per ortho--Lovenox 40 mg [place] daily x 30 days-->check CBC in one week -if platelets <100K, will need to consider alternative means of DVT prophylaxis   Ventricular fibrillation VFib Arrest 2005 -s/p ICD--generator changed 01/18/13   Chronic diastolic CHF (congestive heart failure) Chronic and stable;on no meds   Thrombocytopenia partly dilutional due to IVF -monitor for now--no bleeding  -CBC in one week   Depression Stable;continue zoloft   Hypothyroidism Continue synthroid 112 mcg   Automatic implantable cardioverter-defibrillator in situ 2/2 v fib arrest;followed by cards     Hennie Duos, MD

## 2014-11-20 ENCOUNTER — Non-Acute Institutional Stay (SKILLED_NURSING_FACILITY): Payer: Medicare Other | Admitting: Internal Medicine

## 2014-11-20 ENCOUNTER — Encounter: Payer: Self-pay | Admitting: Internal Medicine

## 2014-11-20 DIAGNOSIS — T148 Other injury of unspecified body region: Secondary | ICD-10-CM

## 2014-11-20 DIAGNOSIS — D696 Thrombocytopenia, unspecified: Secondary | ICD-10-CM

## 2014-11-20 DIAGNOSIS — Z95810 Presence of automatic (implantable) cardiac defibrillator: Secondary | ICD-10-CM | POA: Insufficient documentation

## 2014-11-20 DIAGNOSIS — T148XXA Other injury of unspecified body region, initial encounter: Secondary | ICD-10-CM

## 2014-11-20 DIAGNOSIS — E039 Hypothyroidism, unspecified: Secondary | ICD-10-CM | POA: Insufficient documentation

## 2014-11-20 NOTE — Assessment & Plan Note (Signed)
Continue synthroid 112 mcg

## 2014-11-20 NOTE — Progress Notes (Signed)
Patient ID: [name], female   DOB: Nov 04, 1940, 74 y.o.   MRN: [id number]   this is an acute visit.  Level care skilled.  [facility].     Patient presents with  .  right foot contusion bruising possible edema    HPI: Patient is 74 y.o. female who is admitted to SNF for OT/PT 2/2 hip repair 2/2 fall with fracture-- stay has been fairly unremarkable however nursing staff tonight noted bruising of her lateral right foot and I have been asked to assess this apparently there's also been some mild edema.  Patient is not complaining of any pain with this she does not complain of any shortness of breath I do note she does have a history of thrombocytopenia    she is on Lovenox for anticoagulation   Vital signs are stable.  Past Medical History  Diagnosis Date  . Arthritis   . Arthritis   . Congestive heart failure   . Cardiac defibrillator in place 2005  . Depression   . Osteoporosis   . Thyroid disease   . Osteoporosis   . Thyroid disease   . Fibromyalgia   . Gallbladder disease     Past Surgical History  Procedure Laterality Date  . Colonoscopy    . Polypectomy    . Colonoscopy  2010    Adenomatous with high grade dysplasia  . Cardiac catheterization  2005  . Cardiac defibrillator placement  12-23-2003    MDT single chamber ICD implanted by Dr Caryl Comes [phone] lead)  . Implantable cardioverter defibrillator generator change  01-18-2013    MDT ICD gen change with insertion of new RV lead, capping of 6949 lead by Dr Lovena Le  . Implantable cardioverter defibrillator generator change N/A 01/18/2013    Procedure: IMPLANTABLE CARDIOVERTER DEFIBRILLATOR GENERATOR CHANGE;  Surgeon: Evans Lance, MD;  Location: [facility] CATH LAB;  Service: Cardiovascular;  Laterality: N/A;  . Lead revision N/A 01/18/2013    Procedure: LEAD REVISION;  Surgeon: Evans Lance, MD;  Location[facility] CATH LAB;  Service: Cardiovascular;  Laterality: N/A;  . Hip arthroplasty Left 11/12/2014    Procedure:  Left Hip Hemiarthroplasty;  Surgeon: Augustin Schooling, MD;  Location: [place];  Service: Orthopedics;  Laterality: Left;      Medication List                     acetaminophen 325 MG tablet  Commonly known as:  TYLENOL  Take 2 tablets (650 mg total) by mouth every 6 (six) hours as needed.     DSS 100 MG Caps  Take 100 mg by mouth 2 (two) times daily.     enoxaparin 40 MG/0.4ML injection  Commonly known as:  LOVENOX  Inject 0.4 mLs (40 mg total) into the skin daily.     feeding supplement (ENSURE COMPLETE) Liqd  Take 237 mLs by mouth 2 (two) times daily between meals.     fish oil-omega-3 fatty acids 1000 MG capsule  Take 1 g by mouth daily.     gabapentin 300 MG capsule  Commonly known as:  NEURONTIN  Take 300 mg by mouth 2 (two) times daily.     HYDROcodone-acetaminophen 5-325 MG per tablet  Commonly known as:  NORCO/VICODIN  Take 1-2 tablets by mouth every 6 (six) hours as needed for moderate pain.     levothyroxine 112 MCG tablet  Commonly known as:  SYNTHROID, LEVOTHROID  Take 112 mcg by mouth daily before breakfast.  multivitamin with minerals tablet  Take 1 tablet by mouth daily.     senna 8.6 MG Tabs tablet  Commonly known as:  SENOKOT  Take 2 tablets (17.2 mg total) by mouth daily.     sertraline 100 MG tablet  Commonly known as:  ZOLOFT  Take 100 mg by mouth daily.        No orders of the defined types were placed in this encounter.    Immunization History  Administered Date(s) Administered  . Influenza-Unspecified 06/24/2013    History  Substance Use Topics  . Smoking status: Former Smoker    Quit date: 11/22/2006  . Smokeless tobacco: Never Used  . Alcohol Use: No    Family history is noncontributory    Review of Systems  DATA OBTAINED: from patient GENERAL:  no fevers, fatigue, appetite changes SKIN: No itching, rash or wounds-- however her bruising has been noted to her lateral right foot EYES: No eye pain, redness,  discharge EARS: No earache, tinnitus, change in hearing NOSE: No congestion, drainage or bleeding  MOUTH/THROAT: No mouth or tooth pain, No sore throat RESPIRATORY: No cough, wheezing, SOB CARDIAC: No chest pain, palpitations, lower extremity edema  Except very mild edema to right foot GI: No abdominal pain, No N/V/D or constipation, No heartburn or reflux  GU: No dysuria, frequency or urgency, or incontinence  MUSCULOSKELETAL: No unrelieved bone/joint pain NEUROLOGIC: No headache, dizziness or focal weakness PSYCHIATRIC: No overt anxiety or sadness, No behavior issue.                        Physical Exam  vital signs are stable  GENERAL APPEARANCE: Alert, conversant,  No acute distress lying comfortably in bed.  SKIN: No diaphoresis rash-- she does have a violaceous bruise lateral right foot - this is somewhat linear- there is not really any tenderness associated with this however even with palpation HEAD: Normocephalic, atraumatic  EYES: Conjunctiva/lids clear. Pupils round, reactive. EOMs intact.  EARS: External exam WNL, canals clear. Hearing grossly normal.  NOSE: No deformity or discharge.  MOUTH/THROAT: Lips w/o lesions  RESPIRATORY: Breathing is even, unlabored. Lung sounds are clear   CARDIOVASCULAR: Heart RRR no murmurs, rubs or gallops. No peripheral edema. Except minimal right foot edema   GASTROINTESTINAL: Abdomen is soft, non-tender, not distended w/ normal bowel sounds. GENITOURINARY: Bladder non tender, not distended  MUSCULOSKELETAL: No abnormal joints or musculature-- there is no pain with gentle flexion and extension and range of motion of her right foot no deformity noted--  reduced pedal pulse here NEUROLOGIC:  Cranial nerves 2-12 grossly intact. Moves all extremities  PSYCHIATRIC: Mood and affect appropriate to situation, no behavioral issues  Patient Active Problem List   Diagnosis Date Noted  . Hypothyroidism 11/20/2014  . Thrombocytopenia 11/15/2014   . Hip fracture requiring operative repair 11/13/2014  . Depression 11/13/2014  . Chronic diastolic CHF (congestive heart failure) 11/13/2014  . Hip fracture 11/12/2014  . Fall   . Ventricular fibrillation 12/15/2009  . Automatic implantable cardioverter-defibrillator in situ 12/15/2009  . FIBROMYALGIA 11/02/2009    CBC    Component Value Date/Time   WBC 3.8* 11/17/2014 0607   RBC 3.66* 11/17/2014 0607   HGB 11.4* 11/17/2014 0607   HCT 33.9* 11/17/2014 0607   PLT 179 11/17/2014 0607   MCV 92.6 11/17/2014 0607   LYMPHSABS 0.6* 11/12/2014 1533   MONOABS 1.0 11/12/2014 1533   EOSABS 0.0 11/12/2014 1533   BASOSABS 0.0 11/12/2014 1533  CMP     Component Value Date/Time   NA 139 11/17/2014 0607   K 4.0 11/17/2014 0607   CL 102 11/17/2014 0607   CO2 29 11/17/2014 0607   GLUCOSE 110* 11/17/2014 0607   BUN 9 11/17/2014 0607   CREATININE 0.59 11/17/2014 0607   CALCIUM 8.7 11/17/2014 0607   GFRNONAA 89* 11/17/2014 0607   GFRAA >90 11/17/2014 0607    Assessment and Plan   left foot bruising-will obtain an x-ray-also for mild edema will obtain a venous Doppler rule out any DVT -also with history of thrombocytopenia will need an updated CBC tomorrow stat notify provider of results  Hip fracture requiring operative repair Left displaced femoral neck fracture -Status post left hemiarthroplasty 11/12/2014  -PT/OT--> recommended skilled nursing facility -DVT prophylaxis per ortho--Lovenox 40 mg [place] daily x 30 days-->check CBC in one week -if platelets <100K, will need to consider alternative means of DVT prophylaxis-- will need to update a CBC tomorrow to follow this   Ventricular fibrillation VFib Arrest 2005 -s/p ICD--generator changed 01/18/13-- this is been stable   Chronic diastolic CHF (congestive heart failure) Chronic and stable;on no meds   Thrombocytopenia partly dilutional due to IVF -monitor for now--no bleeding  - on January 25 platelets were 179 this  will need updating as noted above   Depression Stable;continue zoloft   Hypothyroidism Continue synthroid 112 mcg   Automatic implantable cardioverter-defibrillator in situ 2/2 v fib arrest;followed by cards     [phone]

## 2014-11-20 NOTE — Assessment & Plan Note (Signed)
VFib Arrest 2005 -s/p ICD--generator changed 01/18/13

## 2014-11-20 NOTE — Assessment & Plan Note (Signed)
Left displaced femoral neck fracture -Status post left hemiarthroplasty 11/12/2014 -Appreciate orthopedics -PT/OT--> recommended skilled nursing facility -DVT prophylaxis per ortho--Lovenox 40 mg [place] daily x 30 days-->check CBC in one week -if platelets <100K, will need to consider alternative means of DVT prophylaxis

## 2014-11-20 NOTE — Assessment & Plan Note (Signed)
Chronic and stable;on no meds

## 2014-11-20 NOTE — Assessment & Plan Note (Signed)
Stable;continue zoloft

## 2014-11-20 NOTE — Assessment & Plan Note (Signed)
partly dilutional due to IVF -monitor for now--no bleeding  -CBC in one week

## 2014-11-20 NOTE — Assessment & Plan Note (Signed)
2/2 v fib arrest;followed by cards

## 2014-11-23 DIAGNOSIS — T148XXA Other injury of unspecified body region, initial encounter: Secondary | ICD-10-CM | POA: Insufficient documentation

## 2014-11-25 DIAGNOSIS — S72002D Fracture of unspecified part of neck of left femur, subsequent encounter for closed fracture with routine healing: Secondary | ICD-10-CM | POA: Diagnosis not present

## 2014-11-25 DIAGNOSIS — Z96642 Presence of left artificial hip joint: Secondary | ICD-10-CM | POA: Diagnosis not present

## 2014-11-25 DIAGNOSIS — Z471 Aftercare following joint replacement surgery: Secondary | ICD-10-CM | POA: Diagnosis not present

## 2014-12-02 ENCOUNTER — Encounter: Payer: Self-pay | Admitting: Internal Medicine

## 2014-12-02 ENCOUNTER — Non-Acute Institutional Stay (SKILLED_NURSING_FACILITY): Payer: Medicare Other | Admitting: Internal Medicine

## 2014-12-02 DIAGNOSIS — E038 Other specified hypothyroidism: Secondary | ICD-10-CM

## 2014-12-02 DIAGNOSIS — I4901 Ventricular fibrillation: Secondary | ICD-10-CM

## 2014-12-02 DIAGNOSIS — F32A Depression, unspecified: Secondary | ICD-10-CM

## 2014-12-02 DIAGNOSIS — E034 Atrophy of thyroid (acquired): Secondary | ICD-10-CM

## 2014-12-02 DIAGNOSIS — D696 Thrombocytopenia, unspecified: Secondary | ICD-10-CM

## 2014-12-02 DIAGNOSIS — S72002D Fracture of unspecified part of neck of left femur, subsequent encounter for closed fracture with routine healing: Secondary | ICD-10-CM | POA: Diagnosis not present

## 2014-12-02 DIAGNOSIS — F329 Major depressive disorder, single episode, unspecified: Secondary | ICD-10-CM

## 2014-12-02 DIAGNOSIS — I5032 Chronic diastolic (congestive) heart failure: Secondary | ICD-10-CM

## 2014-12-02 DIAGNOSIS — Z95810 Presence of automatic (implantable) cardiac defibrillator: Secondary | ICD-10-CM

## 2014-12-02 NOTE — Progress Notes (Signed)
MRN: [id number] Name: [name]  Sex: female Age: 74 y.o. DOB: [birth year]  [place] #: [facility] Facility/Room: 108 Level Of Care: SNF Provider: Inocencio Homes D Emergency Contacts: Extended Emergency Contact Information Primary Emergency Contact: Marshman,Larry Address: [place], [place] [address] [place] Phone: [phone] Work Phone: [phone] Relation: Spouse Secondary Emergency Contact: Gerome Sam [place] Phone: [phone] Mobile Phone: [phone] Relation: Daughter  Code Status: FULL  Allergies: Sulfonamide derivatives  Chief Complaint  Patient presents with  . Discharge Note    HPI: Patient is 74 y.o. female who was admitted to SNF after a fall and hip fx who is ready to be d/c to home.  Past Medical History  Diagnosis Date  . Arthritis   . Arthritis   . Congestive heart failure   . Cardiac defibrillator in place 2005  . Depression   . Osteoporosis   . Thyroid disease   . Osteoporosis   . Thyroid disease   . Fibromyalgia   . Gallbladder disease     Past Surgical History  Procedure Laterality Date  . Colonoscopy    . Polypectomy    . Colonoscopy  2010    Adenomatous with high grade dysplasia  . Cardiac catheterization  2005  . Cardiac defibrillator placement  12-23-2003    MDT single chamber ICD implanted by Dr Caryl Comes [phone] lead)  . Implantable cardioverter defibrillator generator change  01-18-2013    MDT ICD gen change with insertion of new RV lead, capping of 6949 lead by Dr Lovena Le  . Implantable cardioverter defibrillator generator change N/A 01/18/2013    Procedure: IMPLANTABLE CARDIOVERTER DEFIBRILLATOR GENERATOR CHANGE;  Surgeon: Evans Lance, MD;  Location: [facility] CATH LAB;  Service: Cardiovascular;  Laterality: N/A;  . Lead revision N/A 01/18/2013    Procedure: LEAD REVISION;  Surgeon: Evans Lance, MD;  Location: [facility] CATH LAB;  Service: Cardiovascular;  Laterality: N/A;  .  Hip arthroplasty Left 11/12/2014    Procedure: Left Hip Hemiarthroplasty;  Surgeon: Augustin Schooling, MD;  Location: [place];  Service: Orthopedics;  Laterality: Left;      Medication List       This list is accurate as of: 12/02/14  2:15 PM.  Always use your most recent med list.               acetaminophen 325 MG tablet  Commonly known as:  TYLENOL  Take 2 tablets (650 mg total) by mouth every 6 (six) hours as needed.     DSS 100 MG Caps  Take 100 mg by mouth 2 (two) times daily.     enoxaparin 40 MG/0.4ML injection  Commonly known as:  LOVENOX  Inject 0.4 mLs (40 mg total) into the skin daily.     feeding supplement (ENSURE COMPLETE) Liqd  Take 237 mLs by mouth 2 (two) times daily between meals.     fish oil-omega-3 fatty acids 1000 MG capsule  Take 1 g by mouth daily.     gabapentin 300 MG capsule  Commonly known as:  NEURONTIN  Take 300 mg by mouth 2 (two) times daily.     HYDROcodone-acetaminophen 5-325 MG per tablet  Commonly known as:  NORCO/VICODIN  Take 1-2 tablets by mouth every 6 (six) hours as needed for moderate pain.     levothyroxine 112 MCG tablet  Commonly known as:  SYNTHROID, LEVOTHROID  Take 112 mcg by mouth  daily before breakfast.     multivitamin with minerals tablet  Take 1 tablet by mouth daily.     senna 8.6 MG Tabs tablet  Commonly known as:  SENOKOT  Take 2 tablets (17.2 mg total) by mouth daily.     sertraline 100 MG tablet  Commonly known as:  ZOLOFT  Take 100 mg by mouth daily.        No orders of the defined types were placed in this encounter.    Immunization History  Administered Date(s) Administered  . Influenza-Unspecified 06/24/2013    History  Substance Use Topics  . Smoking status: Former Smoker    Quit date: 11/22/2006  . Smokeless tobacco: Never Used  . Alcohol Use: No    Filed Vitals:   12/02/14 1412  BP: 118/60  Pulse: 80  Temp: 98 F (36.7 C)  Resp: 20    Physical Exam  GENERAL APPEARANCE: Alert,  conversant. No acute distress.  HEENT: Unremarkable. RESPIRATORY: Breathing is even, unlabored. Lung sounds are clear   CARDIOVASCULAR: Heart RRR no murmurs, rubs or gallops. No peripheral edema.  GASTROINTESTINAL: Abdomen is soft, non-tender, not distended w/ normal bowel sounds.  NEUROLOGIC: Cranial nerves 2-12 grossly intact. Moves all extremities  Patient Active Problem List   Diagnosis Date Noted  . Contusion 11/23/2014  . Hypothyroidism 11/20/2014  . Thrombocytopenia 11/15/2014  . Hip fracture requiring operative repair 11/13/2014  . Depression 11/13/2014  . Chronic diastolic CHF (congestive heart failure) 11/13/2014  . Hip fracture 11/12/2014  . Fall   . Ventricular fibrillation 12/15/2009  . Automatic implantable cardioverter-defibrillator in situ 12/15/2009  . FIBROMYALGIA 11/02/2009    CBC    Component Value Date/Time   WBC 3.8* 11/17/2014 0607   RBC 3.66* 11/17/2014 0607   HGB 11.4* 11/17/2014 0607   HCT 33.9* 11/17/2014 0607   PLT 179 11/17/2014 0607   MCV 92.6 11/17/2014 0607   LYMPHSABS 0.6* 11/12/2014 1533   MONOABS 1.0 11/12/2014 1533   EOSABS 0.0 11/12/2014 1533   BASOSABS 0.0 11/12/2014 1533    CMP     Component Value Date/Time   NA 139 11/17/2014 0607   K 4.0 11/17/2014 0607   CL 102 11/17/2014 0607   CO2 29 11/17/2014 0607   GLUCOSE 110* 11/17/2014 0607   BUN 9 11/17/2014 0607   CREATININE 0.59 11/17/2014 0607   CALCIUM 8.7 11/17/2014 0607   GFRNONAA 89* 11/17/2014 0607   GFRAA >90 11/17/2014 0607    Assessment and Plan  Pt is stable for d/c to home with HH/OT/PT/nursing.  Hennie Duos, MD

## 2014-12-04 ENCOUNTER — Other Ambulatory Visit: Payer: Self-pay | Admitting: *Deleted

## 2014-12-04 MED ORDER — HYDROCODONE-ACETAMINOPHEN 5-325 MG PO TABS: ORAL_TABLET | ORAL | Status: DC

## 2014-12-04 NOTE — Telephone Encounter (Signed)
[name]

## 2014-12-05 DIAGNOSIS — W1839XD Other fall on same level, subsequent encounter: Secondary | ICD-10-CM | POA: Diagnosis not present

## 2014-12-05 DIAGNOSIS — M06859 Other specified rheumatoid arthritis, unspecified hip: Secondary | ICD-10-CM | POA: Diagnosis not present

## 2014-12-05 DIAGNOSIS — M6289 Other specified disorders of muscle: Secondary | ICD-10-CM | POA: Diagnosis not present

## 2014-12-05 DIAGNOSIS — F330 Major depressive disorder, recurrent, mild: Secondary | ICD-10-CM | POA: Diagnosis not present

## 2014-12-05 DIAGNOSIS — S72002D Fracture of unspecified part of neck of left femur, subsequent encounter for closed fracture with routine healing: Secondary | ICD-10-CM | POA: Diagnosis not present

## 2014-12-05 DIAGNOSIS — Z96642 Presence of left artificial hip joint: Secondary | ICD-10-CM | POA: Diagnosis not present

## 2014-12-05 DIAGNOSIS — E039 Hypothyroidism, unspecified: Secondary | ICD-10-CM | POA: Diagnosis not present

## 2014-12-06 DIAGNOSIS — M06859 Other specified rheumatoid arthritis, unspecified hip: Secondary | ICD-10-CM | POA: Diagnosis not present

## 2014-12-06 DIAGNOSIS — E039 Hypothyroidism, unspecified: Secondary | ICD-10-CM | POA: Diagnosis not present

## 2014-12-06 DIAGNOSIS — M6289 Other specified disorders of muscle: Secondary | ICD-10-CM | POA: Diagnosis not present

## 2014-12-06 DIAGNOSIS — F330 Major depressive disorder, recurrent, mild: Secondary | ICD-10-CM | POA: Diagnosis not present

## 2014-12-06 DIAGNOSIS — Z96642 Presence of left artificial hip joint: Secondary | ICD-10-CM | POA: Diagnosis not present

## 2014-12-06 DIAGNOSIS — S72002D Fracture of unspecified part of neck of left femur, subsequent encounter for closed fracture with routine healing: Secondary | ICD-10-CM | POA: Diagnosis not present

## 2014-12-06 DIAGNOSIS — W1839XD Other fall on same level, subsequent encounter: Secondary | ICD-10-CM | POA: Diagnosis not present

## 2014-12-09 DIAGNOSIS — Z471 Aftercare following joint replacement surgery: Secondary | ICD-10-CM | POA: Diagnosis not present

## 2014-12-09 DIAGNOSIS — S72002D Fracture of unspecified part of neck of left femur, subsequent encounter for closed fracture with routine healing: Secondary | ICD-10-CM | POA: Diagnosis not present

## 2014-12-09 DIAGNOSIS — Z96642 Presence of left artificial hip joint: Secondary | ICD-10-CM | POA: Diagnosis not present

## 2014-12-10 DIAGNOSIS — M06859 Other specified rheumatoid arthritis, unspecified hip: Secondary | ICD-10-CM | POA: Diagnosis not present

## 2014-12-10 DIAGNOSIS — M6289 Other specified disorders of muscle: Secondary | ICD-10-CM | POA: Diagnosis not present

## 2014-12-10 DIAGNOSIS — W1839XD Other fall on same level, subsequent encounter: Secondary | ICD-10-CM | POA: Diagnosis not present

## 2014-12-10 DIAGNOSIS — Z96642 Presence of left artificial hip joint: Secondary | ICD-10-CM | POA: Diagnosis not present

## 2014-12-10 DIAGNOSIS — S72002D Fracture of unspecified part of neck of left femur, subsequent encounter for closed fracture with routine healing: Secondary | ICD-10-CM | POA: Diagnosis not present

## 2014-12-10 DIAGNOSIS — E039 Hypothyroidism, unspecified: Secondary | ICD-10-CM | POA: Diagnosis not present

## 2014-12-10 DIAGNOSIS — F330 Major depressive disorder, recurrent, mild: Secondary | ICD-10-CM | POA: Diagnosis not present

## 2014-12-12 DIAGNOSIS — E039 Hypothyroidism, unspecified: Secondary | ICD-10-CM | POA: Diagnosis not present

## 2014-12-12 DIAGNOSIS — S72002D Fracture of unspecified part of neck of left femur, subsequent encounter for closed fracture with routine healing: Secondary | ICD-10-CM | POA: Diagnosis not present

## 2014-12-12 DIAGNOSIS — M06859 Other specified rheumatoid arthritis, unspecified hip: Secondary | ICD-10-CM | POA: Diagnosis not present

## 2014-12-12 DIAGNOSIS — W1839XD Other fall on same level, subsequent encounter: Secondary | ICD-10-CM | POA: Diagnosis not present

## 2014-12-12 DIAGNOSIS — F330 Major depressive disorder, recurrent, mild: Secondary | ICD-10-CM | POA: Diagnosis not present

## 2014-12-12 DIAGNOSIS — Z96642 Presence of left artificial hip joint: Secondary | ICD-10-CM | POA: Diagnosis not present

## 2014-12-12 DIAGNOSIS — M6289 Other specified disorders of muscle: Secondary | ICD-10-CM | POA: Diagnosis not present

## 2014-12-13 DIAGNOSIS — E039 Hypothyroidism, unspecified: Secondary | ICD-10-CM | POA: Diagnosis not present

## 2014-12-13 DIAGNOSIS — W1839XD Other fall on same level, subsequent encounter: Secondary | ICD-10-CM | POA: Diagnosis not present

## 2014-12-13 DIAGNOSIS — S72002D Fracture of unspecified part of neck of left femur, subsequent encounter for closed fracture with routine healing: Secondary | ICD-10-CM | POA: Diagnosis not present

## 2014-12-13 DIAGNOSIS — F330 Major depressive disorder, recurrent, mild: Secondary | ICD-10-CM | POA: Diagnosis not present

## 2014-12-13 DIAGNOSIS — M6289 Other specified disorders of muscle: Secondary | ICD-10-CM | POA: Diagnosis not present

## 2014-12-13 DIAGNOSIS — M06859 Other specified rheumatoid arthritis, unspecified hip: Secondary | ICD-10-CM | POA: Diagnosis not present

## 2014-12-13 DIAGNOSIS — Z96642 Presence of left artificial hip joint: Secondary | ICD-10-CM | POA: Diagnosis not present

## 2014-12-15 DIAGNOSIS — M6289 Other specified disorders of muscle: Secondary | ICD-10-CM | POA: Diagnosis not present

## 2014-12-15 DIAGNOSIS — W1839XD Other fall on same level, subsequent encounter: Secondary | ICD-10-CM | POA: Diagnosis not present

## 2014-12-15 DIAGNOSIS — S72002D Fracture of unspecified part of neck of left femur, subsequent encounter for closed fracture with routine healing: Secondary | ICD-10-CM | POA: Diagnosis not present

## 2014-12-15 DIAGNOSIS — M06859 Other specified rheumatoid arthritis, unspecified hip: Secondary | ICD-10-CM | POA: Diagnosis not present

## 2014-12-15 DIAGNOSIS — E039 Hypothyroidism, unspecified: Secondary | ICD-10-CM | POA: Diagnosis not present

## 2014-12-15 DIAGNOSIS — F330 Major depressive disorder, recurrent, mild: Secondary | ICD-10-CM | POA: Diagnosis not present

## 2014-12-15 DIAGNOSIS — Z96642 Presence of left artificial hip joint: Secondary | ICD-10-CM | POA: Diagnosis not present

## 2014-12-17 DIAGNOSIS — M6289 Other specified disorders of muscle: Secondary | ICD-10-CM | POA: Diagnosis not present

## 2014-12-17 DIAGNOSIS — M06859 Other specified rheumatoid arthritis, unspecified hip: Secondary | ICD-10-CM | POA: Diagnosis not present

## 2014-12-17 DIAGNOSIS — E039 Hypothyroidism, unspecified: Secondary | ICD-10-CM | POA: Diagnosis not present

## 2014-12-17 DIAGNOSIS — Z96642 Presence of left artificial hip joint: Secondary | ICD-10-CM | POA: Diagnosis not present

## 2014-12-17 DIAGNOSIS — F330 Major depressive disorder, recurrent, mild: Secondary | ICD-10-CM | POA: Diagnosis not present

## 2014-12-17 DIAGNOSIS — W1839XD Other fall on same level, subsequent encounter: Secondary | ICD-10-CM | POA: Diagnosis not present

## 2014-12-17 DIAGNOSIS — S72002D Fracture of unspecified part of neck of left femur, subsequent encounter for closed fracture with routine healing: Secondary | ICD-10-CM | POA: Diagnosis not present

## 2014-12-19 DIAGNOSIS — M06859 Other specified rheumatoid arthritis, unspecified hip: Secondary | ICD-10-CM | POA: Diagnosis not present

## 2014-12-19 DIAGNOSIS — M6289 Other specified disorders of muscle: Secondary | ICD-10-CM | POA: Diagnosis not present

## 2014-12-19 DIAGNOSIS — Z96642 Presence of left artificial hip joint: Secondary | ICD-10-CM | POA: Diagnosis not present

## 2014-12-19 DIAGNOSIS — W1839XD Other fall on same level, subsequent encounter: Secondary | ICD-10-CM | POA: Diagnosis not present

## 2014-12-19 DIAGNOSIS — S72002D Fracture of unspecified part of neck of left femur, subsequent encounter for closed fracture with routine healing: Secondary | ICD-10-CM | POA: Diagnosis not present

## 2014-12-19 DIAGNOSIS — E039 Hypothyroidism, unspecified: Secondary | ICD-10-CM | POA: Diagnosis not present

## 2014-12-19 DIAGNOSIS — F330 Major depressive disorder, recurrent, mild: Secondary | ICD-10-CM | POA: Diagnosis not present

## 2014-12-20 DIAGNOSIS — F330 Major depressive disorder, recurrent, mild: Secondary | ICD-10-CM | POA: Diagnosis not present

## 2014-12-20 DIAGNOSIS — S72002D Fracture of unspecified part of neck of left femur, subsequent encounter for closed fracture with routine healing: Secondary | ICD-10-CM | POA: Diagnosis not present

## 2014-12-20 DIAGNOSIS — Z96642 Presence of left artificial hip joint: Secondary | ICD-10-CM | POA: Diagnosis not present

## 2014-12-20 DIAGNOSIS — M06859 Other specified rheumatoid arthritis, unspecified hip: Secondary | ICD-10-CM | POA: Diagnosis not present

## 2014-12-20 DIAGNOSIS — W1839XD Other fall on same level, subsequent encounter: Secondary | ICD-10-CM | POA: Diagnosis not present

## 2014-12-20 DIAGNOSIS — E039 Hypothyroidism, unspecified: Secondary | ICD-10-CM | POA: Diagnosis not present

## 2014-12-20 DIAGNOSIS — M6289 Other specified disorders of muscle: Secondary | ICD-10-CM | POA: Diagnosis not present

## 2014-12-22 DIAGNOSIS — M6289 Other specified disorders of muscle: Secondary | ICD-10-CM | POA: Diagnosis not present

## 2014-12-22 DIAGNOSIS — F330 Major depressive disorder, recurrent, mild: Secondary | ICD-10-CM | POA: Diagnosis not present

## 2014-12-22 DIAGNOSIS — E039 Hypothyroidism, unspecified: Secondary | ICD-10-CM | POA: Diagnosis not present

## 2014-12-22 DIAGNOSIS — Z96642 Presence of left artificial hip joint: Secondary | ICD-10-CM | POA: Diagnosis not present

## 2014-12-22 DIAGNOSIS — M06859 Other specified rheumatoid arthritis, unspecified hip: Secondary | ICD-10-CM | POA: Diagnosis not present

## 2014-12-22 DIAGNOSIS — W1839XD Other fall on same level, subsequent encounter: Secondary | ICD-10-CM | POA: Diagnosis not present

## 2014-12-22 DIAGNOSIS — S72002D Fracture of unspecified part of neck of left femur, subsequent encounter for closed fracture with routine healing: Secondary | ICD-10-CM | POA: Diagnosis not present

## 2014-12-24 DIAGNOSIS — Z96642 Presence of left artificial hip joint: Secondary | ICD-10-CM | POA: Diagnosis not present

## 2014-12-24 DIAGNOSIS — M6289 Other specified disorders of muscle: Secondary | ICD-10-CM | POA: Diagnosis not present

## 2014-12-24 DIAGNOSIS — S72002D Fracture of unspecified part of neck of left femur, subsequent encounter for closed fracture with routine healing: Secondary | ICD-10-CM | POA: Diagnosis not present

## 2014-12-24 DIAGNOSIS — F330 Major depressive disorder, recurrent, mild: Secondary | ICD-10-CM | POA: Diagnosis not present

## 2014-12-24 DIAGNOSIS — M06859 Other specified rheumatoid arthritis, unspecified hip: Secondary | ICD-10-CM | POA: Diagnosis not present

## 2014-12-24 DIAGNOSIS — W1839XD Other fall on same level, subsequent encounter: Secondary | ICD-10-CM | POA: Diagnosis not present

## 2014-12-24 DIAGNOSIS — E039 Hypothyroidism, unspecified: Secondary | ICD-10-CM | POA: Diagnosis not present

## 2014-12-26 DIAGNOSIS — E039 Hypothyroidism, unspecified: Secondary | ICD-10-CM | POA: Diagnosis not present

## 2014-12-26 DIAGNOSIS — W1839XD Other fall on same level, subsequent encounter: Secondary | ICD-10-CM | POA: Diagnosis not present

## 2014-12-26 DIAGNOSIS — Z96642 Presence of left artificial hip joint: Secondary | ICD-10-CM | POA: Diagnosis not present

## 2014-12-26 DIAGNOSIS — M6289 Other specified disorders of muscle: Secondary | ICD-10-CM | POA: Diagnosis not present

## 2014-12-26 DIAGNOSIS — M06859 Other specified rheumatoid arthritis, unspecified hip: Secondary | ICD-10-CM | POA: Diagnosis not present

## 2014-12-26 DIAGNOSIS — F330 Major depressive disorder, recurrent, mild: Secondary | ICD-10-CM | POA: Diagnosis not present

## 2014-12-26 DIAGNOSIS — S72002D Fracture of unspecified part of neck of left femur, subsequent encounter for closed fracture with routine healing: Secondary | ICD-10-CM | POA: Diagnosis not present

## 2014-12-27 DIAGNOSIS — M6289 Other specified disorders of muscle: Secondary | ICD-10-CM | POA: Diagnosis not present

## 2014-12-27 DIAGNOSIS — E039 Hypothyroidism, unspecified: Secondary | ICD-10-CM | POA: Diagnosis not present

## 2014-12-27 DIAGNOSIS — M06859 Other specified rheumatoid arthritis, unspecified hip: Secondary | ICD-10-CM | POA: Diagnosis not present

## 2014-12-27 DIAGNOSIS — Z96642 Presence of left artificial hip joint: Secondary | ICD-10-CM | POA: Diagnosis not present

## 2014-12-27 DIAGNOSIS — F330 Major depressive disorder, recurrent, mild: Secondary | ICD-10-CM | POA: Diagnosis not present

## 2014-12-27 DIAGNOSIS — W1839XD Other fall on same level, subsequent encounter: Secondary | ICD-10-CM | POA: Diagnosis not present

## 2014-12-27 DIAGNOSIS — S72002D Fracture of unspecified part of neck of left femur, subsequent encounter for closed fracture with routine healing: Secondary | ICD-10-CM | POA: Diagnosis not present

## 2014-12-29 DIAGNOSIS — F330 Major depressive disorder, recurrent, mild: Secondary | ICD-10-CM | POA: Diagnosis not present

## 2014-12-29 DIAGNOSIS — Z96642 Presence of left artificial hip joint: Secondary | ICD-10-CM | POA: Diagnosis not present

## 2014-12-29 DIAGNOSIS — M06859 Other specified rheumatoid arthritis, unspecified hip: Secondary | ICD-10-CM | POA: Diagnosis not present

## 2014-12-29 DIAGNOSIS — S72002D Fracture of unspecified part of neck of left femur, subsequent encounter for closed fracture with routine healing: Secondary | ICD-10-CM | POA: Diagnosis not present

## 2014-12-29 DIAGNOSIS — W1839XD Other fall on same level, subsequent encounter: Secondary | ICD-10-CM | POA: Diagnosis not present

## 2014-12-29 DIAGNOSIS — E039 Hypothyroidism, unspecified: Secondary | ICD-10-CM | POA: Diagnosis not present

## 2014-12-29 DIAGNOSIS — M6289 Other specified disorders of muscle: Secondary | ICD-10-CM | POA: Diagnosis not present

## 2014-12-31 DIAGNOSIS — S72002D Fracture of unspecified part of neck of left femur, subsequent encounter for closed fracture with routine healing: Secondary | ICD-10-CM | POA: Diagnosis not present

## 2014-12-31 DIAGNOSIS — E039 Hypothyroidism, unspecified: Secondary | ICD-10-CM | POA: Diagnosis not present

## 2014-12-31 DIAGNOSIS — Z96642 Presence of left artificial hip joint: Secondary | ICD-10-CM | POA: Diagnosis not present

## 2014-12-31 DIAGNOSIS — M06859 Other specified rheumatoid arthritis, unspecified hip: Secondary | ICD-10-CM | POA: Diagnosis not present

## 2014-12-31 DIAGNOSIS — W1839XD Other fall on same level, subsequent encounter: Secondary | ICD-10-CM | POA: Diagnosis not present

## 2014-12-31 DIAGNOSIS — M6289 Other specified disorders of muscle: Secondary | ICD-10-CM | POA: Diagnosis not present

## 2014-12-31 DIAGNOSIS — F330 Major depressive disorder, recurrent, mild: Secondary | ICD-10-CM | POA: Diagnosis not present

## 2015-01-02 DIAGNOSIS — S72002D Fracture of unspecified part of neck of left femur, subsequent encounter for closed fracture with routine healing: Secondary | ICD-10-CM | POA: Diagnosis not present

## 2015-01-02 DIAGNOSIS — E039 Hypothyroidism, unspecified: Secondary | ICD-10-CM | POA: Diagnosis not present

## 2015-01-02 DIAGNOSIS — M06859 Other specified rheumatoid arthritis, unspecified hip: Secondary | ICD-10-CM | POA: Diagnosis not present

## 2015-01-02 DIAGNOSIS — M6289 Other specified disorders of muscle: Secondary | ICD-10-CM | POA: Diagnosis not present

## 2015-01-02 DIAGNOSIS — Z96642 Presence of left artificial hip joint: Secondary | ICD-10-CM | POA: Diagnosis not present

## 2015-01-02 DIAGNOSIS — F330 Major depressive disorder, recurrent, mild: Secondary | ICD-10-CM | POA: Diagnosis not present

## 2015-01-02 DIAGNOSIS — W1839XD Other fall on same level, subsequent encounter: Secondary | ICD-10-CM | POA: Diagnosis not present

## 2015-01-03 DIAGNOSIS — M06859 Other specified rheumatoid arthritis, unspecified hip: Secondary | ICD-10-CM | POA: Diagnosis not present

## 2015-01-03 DIAGNOSIS — Z96642 Presence of left artificial hip joint: Secondary | ICD-10-CM | POA: Diagnosis not present

## 2015-01-03 DIAGNOSIS — M6289 Other specified disorders of muscle: Secondary | ICD-10-CM | POA: Diagnosis not present

## 2015-01-03 DIAGNOSIS — F330 Major depressive disorder, recurrent, mild: Secondary | ICD-10-CM | POA: Diagnosis not present

## 2015-01-03 DIAGNOSIS — W1839XD Other fall on same level, subsequent encounter: Secondary | ICD-10-CM | POA: Diagnosis not present

## 2015-01-03 DIAGNOSIS — S72002D Fracture of unspecified part of neck of left femur, subsequent encounter for closed fracture with routine healing: Secondary | ICD-10-CM | POA: Diagnosis not present

## 2015-01-03 DIAGNOSIS — E039 Hypothyroidism, unspecified: Secondary | ICD-10-CM | POA: Diagnosis not present

## 2015-01-05 DIAGNOSIS — F330 Major depressive disorder, recurrent, mild: Secondary | ICD-10-CM | POA: Diagnosis not present

## 2015-01-05 DIAGNOSIS — Z96642 Presence of left artificial hip joint: Secondary | ICD-10-CM | POA: Diagnosis not present

## 2015-01-05 DIAGNOSIS — M6289 Other specified disorders of muscle: Secondary | ICD-10-CM | POA: Diagnosis not present

## 2015-01-05 DIAGNOSIS — E039 Hypothyroidism, unspecified: Secondary | ICD-10-CM | POA: Diagnosis not present

## 2015-01-05 DIAGNOSIS — W1839XD Other fall on same level, subsequent encounter: Secondary | ICD-10-CM | POA: Diagnosis not present

## 2015-01-05 DIAGNOSIS — M06859 Other specified rheumatoid arthritis, unspecified hip: Secondary | ICD-10-CM | POA: Diagnosis not present

## 2015-01-05 DIAGNOSIS — S72002D Fracture of unspecified part of neck of left femur, subsequent encounter for closed fracture with routine healing: Secondary | ICD-10-CM | POA: Diagnosis not present

## 2015-01-07 DIAGNOSIS — M6289 Other specified disorders of muscle: Secondary | ICD-10-CM | POA: Diagnosis not present

## 2015-01-07 DIAGNOSIS — E039 Hypothyroidism, unspecified: Secondary | ICD-10-CM | POA: Diagnosis not present

## 2015-01-07 DIAGNOSIS — M06859 Other specified rheumatoid arthritis, unspecified hip: Secondary | ICD-10-CM | POA: Diagnosis not present

## 2015-01-07 DIAGNOSIS — W1839XD Other fall on same level, subsequent encounter: Secondary | ICD-10-CM | POA: Diagnosis not present

## 2015-01-07 DIAGNOSIS — Z96642 Presence of left artificial hip joint: Secondary | ICD-10-CM | POA: Diagnosis not present

## 2015-01-07 DIAGNOSIS — S72002D Fracture of unspecified part of neck of left femur, subsequent encounter for closed fracture with routine healing: Secondary | ICD-10-CM | POA: Diagnosis not present

## 2015-01-07 DIAGNOSIS — F330 Major depressive disorder, recurrent, mild: Secondary | ICD-10-CM | POA: Diagnosis not present

## 2015-01-09 DIAGNOSIS — F330 Major depressive disorder, recurrent, mild: Secondary | ICD-10-CM | POA: Diagnosis not present

## 2015-01-09 DIAGNOSIS — S72002D Fracture of unspecified part of neck of left femur, subsequent encounter for closed fracture with routine healing: Secondary | ICD-10-CM | POA: Diagnosis not present

## 2015-01-09 DIAGNOSIS — M06859 Other specified rheumatoid arthritis, unspecified hip: Secondary | ICD-10-CM | POA: Diagnosis not present

## 2015-01-09 DIAGNOSIS — W1839XD Other fall on same level, subsequent encounter: Secondary | ICD-10-CM | POA: Diagnosis not present

## 2015-01-09 DIAGNOSIS — Z96642 Presence of left artificial hip joint: Secondary | ICD-10-CM | POA: Diagnosis not present

## 2015-01-09 DIAGNOSIS — E039 Hypothyroidism, unspecified: Secondary | ICD-10-CM | POA: Diagnosis not present

## 2015-01-09 DIAGNOSIS — M6289 Other specified disorders of muscle: Secondary | ICD-10-CM | POA: Diagnosis not present

## 2015-01-10 DIAGNOSIS — W1839XD Other fall on same level, subsequent encounter: Secondary | ICD-10-CM | POA: Diagnosis not present

## 2015-01-10 DIAGNOSIS — F330 Major depressive disorder, recurrent, mild: Secondary | ICD-10-CM | POA: Diagnosis not present

## 2015-01-10 DIAGNOSIS — E039 Hypothyroidism, unspecified: Secondary | ICD-10-CM | POA: Diagnosis not present

## 2015-01-10 DIAGNOSIS — S72002D Fracture of unspecified part of neck of left femur, subsequent encounter for closed fracture with routine healing: Secondary | ICD-10-CM | POA: Diagnosis not present

## 2015-01-10 DIAGNOSIS — M06859 Other specified rheumatoid arthritis, unspecified hip: Secondary | ICD-10-CM | POA: Diagnosis not present

## 2015-01-10 DIAGNOSIS — Z96642 Presence of left artificial hip joint: Secondary | ICD-10-CM | POA: Diagnosis not present

## 2015-01-10 DIAGNOSIS — M6289 Other specified disorders of muscle: Secondary | ICD-10-CM | POA: Diagnosis not present

## 2015-01-12 DIAGNOSIS — W1839XD Other fall on same level, subsequent encounter: Secondary | ICD-10-CM | POA: Diagnosis not present

## 2015-01-12 DIAGNOSIS — M06859 Other specified rheumatoid arthritis, unspecified hip: Secondary | ICD-10-CM | POA: Diagnosis not present

## 2015-01-12 DIAGNOSIS — S72002D Fracture of unspecified part of neck of left femur, subsequent encounter for closed fracture with routine healing: Secondary | ICD-10-CM | POA: Diagnosis not present

## 2015-01-12 DIAGNOSIS — M6289 Other specified disorders of muscle: Secondary | ICD-10-CM | POA: Diagnosis not present

## 2015-01-12 DIAGNOSIS — Z96642 Presence of left artificial hip joint: Secondary | ICD-10-CM | POA: Diagnosis not present

## 2015-01-12 DIAGNOSIS — E039 Hypothyroidism, unspecified: Secondary | ICD-10-CM | POA: Diagnosis not present

## 2015-01-12 DIAGNOSIS — F330 Major depressive disorder, recurrent, mild: Secondary | ICD-10-CM | POA: Diagnosis not present

## 2015-01-13 ENCOUNTER — Ambulatory Visit (INDEPENDENT_AMBULATORY_CARE_PROVIDER_SITE_OTHER): Payer: Medicare Other | Admitting: *Deleted

## 2015-01-13 DIAGNOSIS — I4901 Ventricular fibrillation: Secondary | ICD-10-CM

## 2015-01-13 DIAGNOSIS — I469 Cardiac arrest, cause unspecified: Secondary | ICD-10-CM | POA: Diagnosis not present

## 2015-01-13 DIAGNOSIS — I5032 Chronic diastolic (congestive) heart failure: Secondary | ICD-10-CM

## 2015-01-13 NOTE — Progress Notes (Signed)
Remote ICD transmission.   

## 2015-01-14 DIAGNOSIS — S72002D Fracture of unspecified part of neck of left femur, subsequent encounter for closed fracture with routine healing: Secondary | ICD-10-CM | POA: Diagnosis not present

## 2015-01-14 DIAGNOSIS — Z96642 Presence of left artificial hip joint: Secondary | ICD-10-CM | POA: Diagnosis not present

## 2015-01-14 DIAGNOSIS — W1839XD Other fall on same level, subsequent encounter: Secondary | ICD-10-CM | POA: Diagnosis not present

## 2015-01-14 DIAGNOSIS — E039 Hypothyroidism, unspecified: Secondary | ICD-10-CM | POA: Diagnosis not present

## 2015-01-14 DIAGNOSIS — M06859 Other specified rheumatoid arthritis, unspecified hip: Secondary | ICD-10-CM | POA: Diagnosis not present

## 2015-01-14 DIAGNOSIS — F330 Major depressive disorder, recurrent, mild: Secondary | ICD-10-CM | POA: Diagnosis not present

## 2015-01-14 DIAGNOSIS — M6289 Other specified disorders of muscle: Secondary | ICD-10-CM | POA: Diagnosis not present

## 2015-01-16 DIAGNOSIS — F330 Major depressive disorder, recurrent, mild: Secondary | ICD-10-CM | POA: Diagnosis not present

## 2015-01-16 DIAGNOSIS — E039 Hypothyroidism, unspecified: Secondary | ICD-10-CM | POA: Diagnosis not present

## 2015-01-16 DIAGNOSIS — M6289 Other specified disorders of muscle: Secondary | ICD-10-CM | POA: Diagnosis not present

## 2015-01-16 DIAGNOSIS — M06859 Other specified rheumatoid arthritis, unspecified hip: Secondary | ICD-10-CM | POA: Diagnosis not present

## 2015-01-16 DIAGNOSIS — S72002D Fracture of unspecified part of neck of left femur, subsequent encounter for closed fracture with routine healing: Secondary | ICD-10-CM | POA: Diagnosis not present

## 2015-01-16 DIAGNOSIS — W1839XD Other fall on same level, subsequent encounter: Secondary | ICD-10-CM | POA: Diagnosis not present

## 2015-01-16 DIAGNOSIS — Z96642 Presence of left artificial hip joint: Secondary | ICD-10-CM | POA: Diagnosis not present

## 2015-01-16 LAB — MDC_IDC_ENUM_SESS_TYPE_REMOTE
Battery Remaining Longevity: 125 mo
Battery Voltage: 3.02 V
Brady Statistic RV Percent Paced: 0.01 %
Date Time Interrogation Session: 20160322142049
HighPow Impedance: 83 Ohm
Lead Channel Impedance Value: 399 Ohm
Lead Channel Pacing Threshold Amplitude: 0.875 V
Lead Channel Pacing Threshold Pulse Width: 0.4 ms
Lead Channel Sensing Intrinsic Amplitude: 13.75 mV
Lead Channel Sensing Intrinsic Amplitude: 13.75 mV
Lead Channel Setting Pacing Pulse Width: 0.4 ms
MDC IDC MSMT LEADCHNL RV IMPEDANCE VALUE: 551 Ohm
MDC IDC SET LEADCHNL RV PACING AMPLITUDE: 2.5 V
MDC IDC SET LEADCHNL RV SENSING SENSITIVITY: 0.3 mV
MDC IDC SET ZONE DETECTION INTERVAL: 270 ms
Zone Setting Detection Interval: 360 ms
Zone Setting Detection Interval: 360 ms

## 2015-01-17 DIAGNOSIS — M06859 Other specified rheumatoid arthritis, unspecified hip: Secondary | ICD-10-CM | POA: Diagnosis not present

## 2015-01-17 DIAGNOSIS — E039 Hypothyroidism, unspecified: Secondary | ICD-10-CM | POA: Diagnosis not present

## 2015-01-17 DIAGNOSIS — S72002D Fracture of unspecified part of neck of left femur, subsequent encounter for closed fracture with routine healing: Secondary | ICD-10-CM | POA: Diagnosis not present

## 2015-01-17 DIAGNOSIS — W1839XD Other fall on same level, subsequent encounter: Secondary | ICD-10-CM | POA: Diagnosis not present

## 2015-01-17 DIAGNOSIS — M6289 Other specified disorders of muscle: Secondary | ICD-10-CM | POA: Diagnosis not present

## 2015-01-17 DIAGNOSIS — F330 Major depressive disorder, recurrent, mild: Secondary | ICD-10-CM | POA: Diagnosis not present

## 2015-01-17 DIAGNOSIS — Z96642 Presence of left artificial hip joint: Secondary | ICD-10-CM | POA: Diagnosis not present

## 2015-01-19 DIAGNOSIS — W1839XD Other fall on same level, subsequent encounter: Secondary | ICD-10-CM | POA: Diagnosis not present

## 2015-01-19 DIAGNOSIS — Z96642 Presence of left artificial hip joint: Secondary | ICD-10-CM | POA: Diagnosis not present

## 2015-01-19 DIAGNOSIS — S72002D Fracture of unspecified part of neck of left femur, subsequent encounter for closed fracture with routine healing: Secondary | ICD-10-CM | POA: Diagnosis not present

## 2015-01-19 DIAGNOSIS — F330 Major depressive disorder, recurrent, mild: Secondary | ICD-10-CM | POA: Diagnosis not present

## 2015-01-19 DIAGNOSIS — M06859 Other specified rheumatoid arthritis, unspecified hip: Secondary | ICD-10-CM | POA: Diagnosis not present

## 2015-01-19 DIAGNOSIS — E039 Hypothyroidism, unspecified: Secondary | ICD-10-CM | POA: Diagnosis not present

## 2015-01-19 DIAGNOSIS — M6289 Other specified disorders of muscle: Secondary | ICD-10-CM | POA: Diagnosis not present

## 2015-01-21 DIAGNOSIS — M6289 Other specified disorders of muscle: Secondary | ICD-10-CM | POA: Diagnosis not present

## 2015-01-21 DIAGNOSIS — W1839XD Other fall on same level, subsequent encounter: Secondary | ICD-10-CM | POA: Diagnosis not present

## 2015-01-21 DIAGNOSIS — Z96642 Presence of left artificial hip joint: Secondary | ICD-10-CM | POA: Diagnosis not present

## 2015-01-21 DIAGNOSIS — S72002D Fracture of unspecified part of neck of left femur, subsequent encounter for closed fracture with routine healing: Secondary | ICD-10-CM | POA: Diagnosis not present

## 2015-01-21 DIAGNOSIS — M06859 Other specified rheumatoid arthritis, unspecified hip: Secondary | ICD-10-CM | POA: Diagnosis not present

## 2015-01-21 DIAGNOSIS — E039 Hypothyroidism, unspecified: Secondary | ICD-10-CM | POA: Diagnosis not present

## 2015-01-21 DIAGNOSIS — F330 Major depressive disorder, recurrent, mild: Secondary | ICD-10-CM | POA: Diagnosis not present

## 2015-01-23 DIAGNOSIS — E039 Hypothyroidism, unspecified: Secondary | ICD-10-CM | POA: Diagnosis not present

## 2015-01-23 DIAGNOSIS — S72002D Fracture of unspecified part of neck of left femur, subsequent encounter for closed fracture with routine healing: Secondary | ICD-10-CM | POA: Diagnosis not present

## 2015-01-23 DIAGNOSIS — M6289 Other specified disorders of muscle: Secondary | ICD-10-CM | POA: Diagnosis not present

## 2015-01-23 DIAGNOSIS — F330 Major depressive disorder, recurrent, mild: Secondary | ICD-10-CM | POA: Diagnosis not present

## 2015-01-23 DIAGNOSIS — Z96642 Presence of left artificial hip joint: Secondary | ICD-10-CM | POA: Diagnosis not present

## 2015-01-23 DIAGNOSIS — W1839XD Other fall on same level, subsequent encounter: Secondary | ICD-10-CM | POA: Diagnosis not present

## 2015-01-23 DIAGNOSIS — M06859 Other specified rheumatoid arthritis, unspecified hip: Secondary | ICD-10-CM | POA: Diagnosis not present

## 2015-01-24 DIAGNOSIS — M06859 Other specified rheumatoid arthritis, unspecified hip: Secondary | ICD-10-CM | POA: Diagnosis not present

## 2015-01-24 DIAGNOSIS — S72002D Fracture of unspecified part of neck of left femur, subsequent encounter for closed fracture with routine healing: Secondary | ICD-10-CM | POA: Diagnosis not present

## 2015-01-24 DIAGNOSIS — Z96642 Presence of left artificial hip joint: Secondary | ICD-10-CM | POA: Diagnosis not present

## 2015-01-24 DIAGNOSIS — F330 Major depressive disorder, recurrent, mild: Secondary | ICD-10-CM | POA: Diagnosis not present

## 2015-01-24 DIAGNOSIS — M6289 Other specified disorders of muscle: Secondary | ICD-10-CM | POA: Diagnosis not present

## 2015-01-24 DIAGNOSIS — W1839XD Other fall on same level, subsequent encounter: Secondary | ICD-10-CM | POA: Diagnosis not present

## 2015-01-24 DIAGNOSIS — E039 Hypothyroidism, unspecified: Secondary | ICD-10-CM | POA: Diagnosis not present

## 2015-01-26 DIAGNOSIS — M06859 Other specified rheumatoid arthritis, unspecified hip: Secondary | ICD-10-CM | POA: Diagnosis not present

## 2015-01-26 DIAGNOSIS — W1839XD Other fall on same level, subsequent encounter: Secondary | ICD-10-CM | POA: Diagnosis not present

## 2015-01-26 DIAGNOSIS — S72002D Fracture of unspecified part of neck of left femur, subsequent encounter for closed fracture with routine healing: Secondary | ICD-10-CM | POA: Diagnosis not present

## 2015-01-26 DIAGNOSIS — F330 Major depressive disorder, recurrent, mild: Secondary | ICD-10-CM | POA: Diagnosis not present

## 2015-01-26 DIAGNOSIS — E039 Hypothyroidism, unspecified: Secondary | ICD-10-CM | POA: Diagnosis not present

## 2015-01-26 DIAGNOSIS — M6289 Other specified disorders of muscle: Secondary | ICD-10-CM | POA: Diagnosis not present

## 2015-01-26 DIAGNOSIS — Z96642 Presence of left artificial hip joint: Secondary | ICD-10-CM | POA: Diagnosis not present

## 2015-01-28 ENCOUNTER — Encounter: Payer: Self-pay | Admitting: Cardiology

## 2015-01-28 DIAGNOSIS — S72002D Fracture of unspecified part of neck of left femur, subsequent encounter for closed fracture with routine healing: Secondary | ICD-10-CM | POA: Diagnosis not present

## 2015-01-28 DIAGNOSIS — M6289 Other specified disorders of muscle: Secondary | ICD-10-CM | POA: Diagnosis not present

## 2015-01-28 DIAGNOSIS — F330 Major depressive disorder, recurrent, mild: Secondary | ICD-10-CM | POA: Diagnosis not present

## 2015-01-28 DIAGNOSIS — Z96642 Presence of left artificial hip joint: Secondary | ICD-10-CM | POA: Diagnosis not present

## 2015-01-28 DIAGNOSIS — E039 Hypothyroidism, unspecified: Secondary | ICD-10-CM | POA: Diagnosis not present

## 2015-01-28 DIAGNOSIS — W1839XD Other fall on same level, subsequent encounter: Secondary | ICD-10-CM | POA: Diagnosis not present

## 2015-01-28 DIAGNOSIS — M06859 Other specified rheumatoid arthritis, unspecified hip: Secondary | ICD-10-CM | POA: Diagnosis not present

## 2015-01-30 DIAGNOSIS — E039 Hypothyroidism, unspecified: Secondary | ICD-10-CM | POA: Diagnosis not present

## 2015-01-30 DIAGNOSIS — M6289 Other specified disorders of muscle: Secondary | ICD-10-CM | POA: Diagnosis not present

## 2015-01-30 DIAGNOSIS — M06859 Other specified rheumatoid arthritis, unspecified hip: Secondary | ICD-10-CM | POA: Diagnosis not present

## 2015-01-30 DIAGNOSIS — W1839XD Other fall on same level, subsequent encounter: Secondary | ICD-10-CM | POA: Diagnosis not present

## 2015-01-30 DIAGNOSIS — S72002D Fracture of unspecified part of neck of left femur, subsequent encounter for closed fracture with routine healing: Secondary | ICD-10-CM | POA: Diagnosis not present

## 2015-01-30 DIAGNOSIS — F330 Major depressive disorder, recurrent, mild: Secondary | ICD-10-CM | POA: Diagnosis not present

## 2015-01-30 DIAGNOSIS — Z96642 Presence of left artificial hip joint: Secondary | ICD-10-CM | POA: Diagnosis not present

## 2015-02-03 ENCOUNTER — Encounter: Payer: Self-pay | Admitting: Internal Medicine

## 2015-02-11 DIAGNOSIS — E041 Nontoxic single thyroid nodule: Secondary | ICD-10-CM | POA: Diagnosis not present

## 2015-02-18 DIAGNOSIS — E041 Nontoxic single thyroid nodule: Secondary | ICD-10-CM | POA: Diagnosis not present

## 2015-02-18 DIAGNOSIS — M810 Age-related osteoporosis without current pathological fracture: Secondary | ICD-10-CM | POA: Diagnosis not present

## 2015-02-18 DIAGNOSIS — S72009A Fracture of unspecified part of neck of unspecified femur, initial encounter for closed fracture: Secondary | ICD-10-CM | POA: Diagnosis not present

## 2015-04-13 ENCOUNTER — Ambulatory Visit (INDEPENDENT_AMBULATORY_CARE_PROVIDER_SITE_OTHER): Payer: Medicare Other | Admitting: Internal Medicine

## 2015-04-13 ENCOUNTER — Encounter: Payer: Self-pay | Admitting: Internal Medicine

## 2015-04-13 VITALS — BP 122/60 | HR 81 | Ht 63.0 in | Wt 151.0 lb

## 2015-04-13 DIAGNOSIS — Z95810 Presence of automatic (implantable) cardiac defibrillator: Secondary | ICD-10-CM

## 2015-04-13 DIAGNOSIS — I5032 Chronic diastolic (congestive) heart failure: Secondary | ICD-10-CM

## 2015-04-13 DIAGNOSIS — I4901 Ventricular fibrillation: Secondary | ICD-10-CM | POA: Diagnosis not present

## 2015-04-13 DIAGNOSIS — Z4502 Encounter for adjustment and management of automatic implantable cardiac defibrillator: Secondary | ICD-10-CM

## 2015-04-13 NOTE — Patient Instructions (Signed)
Medication Instructions:  Your physician recommends that you continue on your current medications as directed. Please refer to the Current Medication list given to you today.  Labwork: None ordered  Testing/Procedures: None ordered  Follow-Up: Remote monitoring is used to monitor your Pacemaker of ICD from home. This monitoring reduces the number of office visits required to check your device to one time per year. It allows us to keep an eye on the functioning of your device to ensure it is working properly. You are scheduled for a device check from home on 07/13/15. You may send your transmission at any time that day. If you have a wireless device, the transmission will be sent automatically. After your physician reviews your transmission, you will receive a postcard with your next transmission date.  Your physician wants you to follow-up in: 1 year with Dr. Taylor.  You will receive a reminder letter in the mail two months in advance. If you don't receive a letter, please call our office to schedule the follow-up appointment.  Thank you for choosing  [facility]!!          

## 2015-04-13 NOTE — Assessment & Plan Note (Signed)
Her symptoms are class 2. She will continue her current meds. 

## 2015-04-13 NOTE — Progress Notes (Signed)
HPI [name] returns today for followup. She is a pleasant elderly woman with an unexplained VF arrest, s/p rescucitation. In the interim, she denies chest pain, sob, or syncope. No ICD shock. She has fractured her hip in the interim and undergone surgery. She still walks with a cane but is gradually improving. She thinks that her memory is still not as good as it was before her VF arrest.   Allergies  Allergen Reactions  . Sulfonamide Derivatives Other (See Comments)    Blisters in mouth     Current Outpatient Prescriptions  Medication Sig Dispense Refill  . acetaminophen (TYLENOL) 325 MG tablet Take 2 tablets (650 mg total) by mouth every 6 (six) hours as needed. 60 tablet 0  . feeding supplement, ENSURE COMPLETE, (ENSURE COMPLETE) LIQD Take 237 mLs by mouth 2 (two) times daily between meals. 60 Bottle 0  . fish oil-omega-3 fatty acids 1000 MG capsule Take 1 g by mouth daily.    Marland Kitchen gabapentin (NEURONTIN) 300 MG capsule Take 300 mg by mouth 2 (two) times daily.    Marland Kitchen levothyroxine (SYNTHROID, LEVOTHROID) 112 MCG tablet Take 112 mcg by mouth daily before breakfast.    . Multiple Vitamins-Minerals (MULTIVITAMIN WITH MINERALS) tablet Take 1 tablet by mouth daily.    . sertraline (ZOLOFT) 100 MG tablet Take 100 mg by mouth daily.     No current facility-administered medications for this visit.     Past Medical History  Diagnosis Date  . Arthritis   . Arthritis   . Congestive heart failure   . Cardiac defibrillator in place 2005  . Depression   . Osteoporosis   . Thyroid disease   . Osteoporosis   . Thyroid disease   . Fibromyalgia   . Gallbladder disease     ROS:   All systems reviewed and negative except as noted in the HPI.   Past Surgical History  Procedure Laterality Date  . Colonoscopy    . Polypectomy    . Colonoscopy  2010    Adenomatous with high grade dysplasia  . Cardiac catheterization  2005  . Cardiac defibrillator placement  12-23-2003    MDT single  chamber ICD implanted by Dr Caryl Comes [phone] lead)  . Implantable cardioverter defibrillator generator change  01-18-2013    MDT ICD gen change with insertion of new RV lead, capping of 6949 lead by Dr Lovena Le  . Implantable cardioverter defibrillator generator change N/A 01/18/2013    Procedure: IMPLANTABLE CARDIOVERTER DEFIBRILLATOR GENERATOR CHANGE;  Surgeon: Evans Lance, MD;  Location: [facility] CATH LAB;  Service: Cardiovascular;  Laterality: N/A;  . Lead revision N/A 01/18/2013    Procedure: LEAD REVISION;  Surgeon: Evans Lance, MD;  Location: [facility] CATH LAB;  Service: Cardiovascular;  Laterality: N/A;  . Hip arthroplasty Left 11/12/2014    Procedure: Left Hip Hemiarthroplasty;  Surgeon: Augustin Schooling, MD;  Location: [place];  Service: Orthopedics;  Laterality: Left;     Family History  Problem Relation Age of Onset  . Breast cancer Sister      History   Social History  . Marital Status: Married    Spouse Name: N/A  . Number of Children: N/A  . Years of Education: N/A   Occupational History  . Not on file.   Social History Main Topics  . Smoking status: Former Smoker    Quit date: 11/22/2006  . Smokeless tobacco: Never Used  . Alcohol Use: No  . Drug Use: No  . Sexual Activity: Not  on file   Other Topics Concern  . Not on file   Social History Narrative     BP 122/60 mmHg  Pulse 81  Ht 5\' 3"  (1.6 m)  Wt 151 lb (68.493 kg)  BMI 26.76 kg/m2  SpO2 96%  Physical Exam:  Well appearing 74 yo woman HEENT: Unremarkable Neck:  7 cm JVD, no thyromegally Lungs:  Clear with no wheezes, rales, or rhonchi HEART:  Regular rate rhythm, no murmurs, no rubs, no clicks Abd:  soft, positive bowel sounds, no organomegally, no rebound, no guarding Ext:  2 plus pulses, no edema, no cyanosis, no clubbing Skin:  No rashes no nodules Neuro:  CN II through XII intact, motor grossly intact  DEVICE  Normal device function.  See PaceArt for details.   Assess/Plan:

## 2015-04-13 NOTE — Assessment & Plan Note (Signed)
She has had no recurrent symptoms. She will continue her current meds.

## 2015-04-13 NOTE — Assessment & Plan Note (Signed)
Her medtornic ICD is working normally. Will recheck in several months.

## 2015-04-17 LAB — CUP PACEART INCLINIC DEVICE CHECK
Battery Remaining Longevity: 123 mo
Battery Voltage: 3.02 V
Brady Statistic RV Percent Paced: 0.01 %
Date Time Interrogation Session: 20160620163858
HighPow Impedance: 228 Ohm
HighPow Impedance: 74 Ohm
Lead Channel Pacing Threshold Amplitude: 1 V
Lead Channel Sensing Intrinsic Amplitude: 13.5 mV
Lead Channel Sensing Intrinsic Amplitude: 18.125 mV
Lead Channel Setting Pacing Amplitude: 2.5 V
Lead Channel Setting Pacing Pulse Width: 0.4 ms
MDC IDC MSMT LEADCHNL RV IMPEDANCE VALUE: 475 Ohm
MDC IDC MSMT LEADCHNL RV PACING THRESHOLD PULSEWIDTH: 0.4 ms
MDC IDC SET LEADCHNL RV SENSING SENSITIVITY: 0.3 mV
MDC IDC SET ZONE DETECTION INTERVAL: 360 ms
Zone Setting Detection Interval: 270 ms
Zone Setting Detection Interval: 360 ms

## 2015-05-25 DIAGNOSIS — E780 Pure hypercholesterolemia: Secondary | ICD-10-CM | POA: Diagnosis not present

## 2015-05-25 DIAGNOSIS — E039 Hypothyroidism, unspecified: Secondary | ICD-10-CM | POA: Diagnosis not present

## 2015-05-28 DIAGNOSIS — Z1212 Encounter for screening for malignant neoplasm of rectum: Secondary | ICD-10-CM | POA: Diagnosis not present

## 2015-05-28 DIAGNOSIS — I5032 Chronic diastolic (congestive) heart failure: Secondary | ICD-10-CM | POA: Diagnosis not present

## 2015-05-28 DIAGNOSIS — Z0000 Encounter for general adult medical examination without abnormal findings: Secondary | ICD-10-CM | POA: Diagnosis not present

## 2015-05-28 DIAGNOSIS — Z8679 Personal history of other diseases of the circulatory system: Secondary | ICD-10-CM | POA: Diagnosis not present

## 2015-05-28 DIAGNOSIS — Z95810 Presence of automatic (implantable) cardiac defibrillator: Secondary | ICD-10-CM | POA: Diagnosis not present

## 2015-06-04 DIAGNOSIS — M810 Age-related osteoporosis without current pathological fracture: Secondary | ICD-10-CM | POA: Diagnosis not present

## 2015-06-15 DIAGNOSIS — F339 Major depressive disorder, recurrent, unspecified: Secondary | ICD-10-CM | POA: Diagnosis not present

## 2015-06-15 DIAGNOSIS — M8580 Other specified disorders of bone density and structure, unspecified site: Secondary | ICD-10-CM | POA: Diagnosis not present

## 2015-06-15 DIAGNOSIS — E039 Hypothyroidism, unspecified: Secondary | ICD-10-CM | POA: Diagnosis not present

## 2015-06-18 ENCOUNTER — Other Ambulatory Visit: Payer: Self-pay | Admitting: Internal Medicine

## 2015-06-18 DIAGNOSIS — Z1231 Encounter for screening mammogram for malignant neoplasm of breast: Secondary | ICD-10-CM

## 2015-07-09 ENCOUNTER — Ambulatory Visit (INDEPENDENT_AMBULATORY_CARE_PROVIDER_SITE_OTHER): Payer: Medicare Other

## 2015-07-09 DIAGNOSIS — Z1231 Encounter for screening mammogram for malignant neoplasm of breast: Secondary | ICD-10-CM | POA: Diagnosis not present

## 2015-07-13 ENCOUNTER — Ambulatory Visit (INDEPENDENT_AMBULATORY_CARE_PROVIDER_SITE_OTHER): Payer: Medicare Other | Admitting: *Deleted

## 2015-07-13 ENCOUNTER — Telehealth: Payer: Self-pay | Admitting: Cardiology

## 2015-07-13 DIAGNOSIS — I5032 Chronic diastolic (congestive) heart failure: Secondary | ICD-10-CM | POA: Diagnosis not present

## 2015-07-13 DIAGNOSIS — I4901 Ventricular fibrillation: Secondary | ICD-10-CM | POA: Diagnosis not present

## 2015-07-13 NOTE — Telephone Encounter (Signed)
LMOVM reminding pt to send remote transmission.   

## 2015-07-14 NOTE — Progress Notes (Signed)
Remote ICD transmission.   

## 2015-07-21 LAB — CUP PACEART REMOTE DEVICE CHECK
Date Time Interrogation Session: 20160927080635
HIGH POWER IMPEDANCE MEASURED VALUE: 74 Ohm
Lead Channel Impedance Value: 608 Ohm
Lead Channel Pacing Threshold Pulse Width: 0.4 ms
Lead Channel Sensing Intrinsic Amplitude: 14.9 mV
Lead Channel Setting Pacing Pulse Width: 0.4 ms
Lead Channel Setting Sensing Sensitivity: 0.3 mV
MDC IDC MSMT LEADCHNL RV PACING THRESHOLD AMPLITUDE: 1 V
MDC IDC SET LEADCHNL RV PACING AMPLITUDE: 2.5 V
MDC IDC SET ZONE DETECTION INTERVAL: 270 ms
MDC IDC SET ZONE DETECTION INTERVAL: 360 ms
MDC IDC STAT BRADY RV PERCENT PACED: 0.1 % — AB
Zone Setting Detection Interval: 360 ms

## 2015-07-24 ENCOUNTER — Encounter: Payer: Self-pay | Admitting: Cardiology

## 2015-08-10 ENCOUNTER — Encounter: Payer: Self-pay | Admitting: Internal Medicine

## 2015-08-13 DIAGNOSIS — E041 Nontoxic single thyroid nodule: Secondary | ICD-10-CM | POA: Diagnosis not present

## 2015-08-13 DIAGNOSIS — E039 Hypothyroidism, unspecified: Secondary | ICD-10-CM | POA: Diagnosis not present

## 2015-08-20 DIAGNOSIS — E041 Nontoxic single thyroid nodule: Secondary | ICD-10-CM | POA: Diagnosis not present

## 2015-08-20 DIAGNOSIS — E789 Disorder of lipoprotein metabolism, unspecified: Secondary | ICD-10-CM | POA: Diagnosis not present

## 2015-08-28 ENCOUNTER — Other Ambulatory Visit: Payer: Self-pay | Admitting: Endocrinology

## 2015-08-28 DIAGNOSIS — E041 Nontoxic single thyroid nodule: Secondary | ICD-10-CM

## 2015-10-12 ENCOUNTER — Telehealth: Payer: Self-pay | Admitting: Cardiology

## 2015-10-12 ENCOUNTER — Ambulatory Visit (INDEPENDENT_AMBULATORY_CARE_PROVIDER_SITE_OTHER): Payer: Medicare Other | Admitting: *Deleted

## 2015-10-12 DIAGNOSIS — I5032 Chronic diastolic (congestive) heart failure: Secondary | ICD-10-CM | POA: Diagnosis not present

## 2015-10-12 DIAGNOSIS — I4901 Ventricular fibrillation: Secondary | ICD-10-CM

## 2015-10-12 NOTE — Telephone Encounter (Signed)
Spoke with pt and reminded pt of remote transmission that is due today. Pt verbalized understanding.   

## 2015-10-13 ENCOUNTER — Encounter: Payer: Self-pay | Admitting: Cardiology

## 2015-10-13 NOTE — Progress Notes (Signed)
Remote ICD transmission.   

## 2015-10-15 ENCOUNTER — Encounter: Payer: Self-pay | Admitting: Cardiology

## 2015-10-15 LAB — CUP PACEART REMOTE DEVICE CHECK
Battery Remaining Longevity: 120 mo
Battery Voltage: 3.02 V
HighPow Impedance: 67 Ohm
Implantable Lead Implant Date: 20140328
Implantable Lead Location: 753860
Implantable Lead Model: 6935
Lead Channel Pacing Threshold Pulse Width: 0.4 ms
Lead Channel Sensing Intrinsic Amplitude: 14.625 mV
Lead Channel Sensing Intrinsic Amplitude: 14.625 mV
Lead Channel Setting Pacing Amplitude: 2.5 V
Lead Channel Setting Pacing Pulse Width: 0.4 ms
Lead Channel Setting Sensing Sensitivity: 0.3 mV
MDC IDC MSMT LEADCHNL RV IMPEDANCE VALUE: 399 Ohm
MDC IDC MSMT LEADCHNL RV IMPEDANCE VALUE: 513 Ohm
MDC IDC MSMT LEADCHNL RV PACING THRESHOLD AMPLITUDE: 0.75 V
MDC IDC SESS DTM: 20161220175621
MDC IDC STAT BRADY RV PERCENT PACED: 0.01 %

## 2015-12-16 DIAGNOSIS — E7800 Pure hypercholesterolemia, unspecified: Secondary | ICD-10-CM | POA: Diagnosis not present

## 2015-12-16 DIAGNOSIS — E781 Pure hyperglyceridemia: Secondary | ICD-10-CM | POA: Diagnosis not present

## 2015-12-22 DIAGNOSIS — R413 Other amnesia: Secondary | ICD-10-CM | POA: Diagnosis not present

## 2015-12-22 DIAGNOSIS — E7800 Pure hypercholesterolemia, unspecified: Secondary | ICD-10-CM | POA: Diagnosis not present

## 2015-12-22 DIAGNOSIS — E039 Hypothyroidism, unspecified: Secondary | ICD-10-CM | POA: Diagnosis not present

## 2015-12-22 DIAGNOSIS — F339 Major depressive disorder, recurrent, unspecified: Secondary | ICD-10-CM | POA: Diagnosis not present

## 2015-12-22 DIAGNOSIS — I5032 Chronic diastolic (congestive) heart failure: Secondary | ICD-10-CM | POA: Diagnosis not present

## 2015-12-31 ENCOUNTER — Ambulatory Visit (INDEPENDENT_AMBULATORY_CARE_PROVIDER_SITE_OTHER): Payer: Medicare Other | Admitting: Neurology

## 2015-12-31 ENCOUNTER — Encounter: Payer: Self-pay | Admitting: Neurology

## 2015-12-31 VITALS — BP 130/69 | HR 61 | Ht 63.0 in | Wt 153.4 lb

## 2015-12-31 DIAGNOSIS — G931 Anoxic brain damage, not elsewhere classified: Secondary | ICD-10-CM | POA: Diagnosis not present

## 2015-12-31 DIAGNOSIS — R413 Other amnesia: Secondary | ICD-10-CM

## 2015-12-31 NOTE — Progress Notes (Signed)
[facility]    Provider:  Dr Jaynee Eagles Referring Provider: Deland Pretty, MD Primary Care Physician:  Horatio Pel, MD  CC:  Memory issues  HPI:  [name] is a 75 y.o. female here as a referral from Dr. Shelia Media for memory issues. PMHx fibromyalgia, depression, insomnia, cardiac arrest due to vfib s/p AICD, acquired thyroid disease, chronic LBP, HLD.  In February of 2005 she had cardiac arrest, oxygen loss and she was declared brain dead at the hospital and the family was deciding to take off of life support. She had a cardiac defibrillator placed.  Ever since then she has had memory issues. Memory improved gradually after that but still not back to beforehand. More short-term memory loss. She has not declined in memory. Daughter is here and provides information. Patient can't remember what she read in the paper today. Having difficulty retaining new memories like who she spoke to earlier. She also can't remember things in the past like neighbors names. Lives with her husband, she takes care of the bills, haven't missed any bills, home is well cared for, she drives and she gets lost a little bit. She gets confused with memories. She worries too much about things. No increased confusion in the evening. Patient says she has a hard time thinking and sometimes expressing herslef. No hallucinations or delusion.   Reviewed notes, labs and imaging from outside physicians, which showed: B12 and TSH checked at pcp.   Admitted 12/16/2003 after being found unresponsive. The down time was not clear, but could have been up to 15 minutes. She subsequently converted to ventricular fibrillation and in the course of resuscitation was shocked nine times. Ultimately, she remained in initial supraventricular rhythm and then sinus tachycardia. She was also intubated in the field and received Epinephrine, Atropine, and lidocaine. On arrival to [facility] the patient initially was  unresponsive, but did have brainstem reflexes with decerebrate end corticate posturing upon arrival. The patient was seen by neurology. In the next ensuing days her mental function improved  CT of the head 12/16/2003: personally reviewed images and agree with the following   CT HEAD WITHOUT CONTRAST  Routine non-contrast head CT was performed. There is no evidence of intracranial hemorrhage, brain edema, or mass effect. The ventricles are normal. No extra-axial abnormalities are identified. Bone windows show no significant abnormalities.   IMPRESSION  Negative non-contrast head CT.   12/16/2015: CBC and bmp unremarkable.Per pcp notes, they are checking B12 and tsh and will defer to them on these labs.   Review of Systems: Patient complains of symptoms per HPI as well as the following symptoms: memory loss, confusion, depression. Pertinent negatives per HPI. All others negative.   Social History   Social History  . Marital Status: Married    Spouse Name: [name]  . Number of Children: 2  . Years of Education: 12   Occupational History  . Not on file.   Social History Main Topics  . Smoking status: Current Every Day Smoker -- 0.50 packs/day    Types: Cigarettes  . Smokeless tobacco: Never Used  . Alcohol Use: No  . Drug Use: No  . Sexual Activity: Not on file   Other Topics Concern  . Not on file   Social History Narrative   Lives with husband, [name]   Caffeine use: 6 cups per day (coffee)    Family History  Problem Relation Age of Onset  . Breast cancer Sister   . Seizures Neg Hx   .  Dementia Neg Hx     Past Medical History  Diagnosis Date  . Arthritis   . Arthritis   . Congestive heart failure ([place])   . Cardiac defibrillator in place 2005  . Depression   . Osteoporosis   . Thyroid disease   . Osteoporosis   . Thyroid disease   . Fibromyalgia   . Gallbladder disease     Past Surgical History  Procedure Laterality Date  . Colonoscopy    .  Polypectomy    . Colonoscopy  2010    Adenomatous with high grade dysplasia  . Cardiac catheterization  2005  . Cardiac defibrillator placement  12-23-2003    MDT single chamber ICD implanted by Dr Caryl Comes [phone] lead)  . Implantable cardioverter defibrillator generator change  01-18-2013    MDT ICD gen change with insertion of new RV lead, capping of 6949 lead by Dr Lovena Le  . Implantable cardioverter defibrillator generator change N/A 01/18/2013    Procedure: IMPLANTABLE CARDIOVERTER DEFIBRILLATOR GENERATOR CHANGE;  Surgeon: Evans Lance, MD;  Location: [facility] CATH LAB;  Service: Cardiovascular;  Laterality: N/A;  . Lead revision N/A 01/18/2013    Procedure: LEAD REVISION;  Surgeon: Evans Lance, MD;  Location: [facility] CATH LAB;  Service: Cardiovascular;  Laterality: N/A;  . Hip arthroplasty Left 11/12/2014    Procedure: Left Hip Hemiarthroplasty;  Surgeon: Augustin Schooling, MD;  Location: [place];  Service: Orthopedics;  Laterality: Left;  . Pacemaker insertion      NOT MRI SAFE    Current Outpatient Prescriptions  Medication Sig Dispense Refill  . acetaminophen (TYLENOL) 325 MG tablet Take 2 tablets (650 mg total) by mouth every 6 (six) hours as needed. 60 tablet 0  . fish oil-omega-3 fatty acids 1000 MG capsule Take 1 g by mouth daily.    Marland Kitchen gabapentin (NEURONTIN) 300 MG capsule Take 300 mg by mouth 2 (two) times daily.    Marland Kitchen levothyroxine (SYNTHROID, LEVOTHROID) 112 MCG tablet Take 112 mcg by mouth daily before breakfast.    . Multiple Vitamins-Minerals (MULTIVITAMIN WITH MINERALS) tablet Take 1 tablet by mouth daily.    . sertraline (ZOLOFT) 100 MG tablet Take 100 mg by mouth daily.     No current facility-administered medications for this visit.    Allergies as of 12/31/2015 - Review Complete 12/31/2015  Allergen Reaction Noted  . Sulfonamide derivatives Other (See Comments)     Vitals: BP 130/69 mmHg  Pulse 61  Ht 5\' 3"  (1.6 m)  Wt 153 lb 6.4 oz (69.582 kg)  BMI 27.18 kg/m2 Last Weight:   Wt Readings from Last 1 Encounters:  12/31/15 153 lb 6.4 oz (69.582 kg)   Last Height:   Ht Readings from Last 1 Encounters:  12/31/15 5\' 3"  (1.6 m)   Physical exam: Exam: Gen: NAD, conversant, well nourised, well groomed                     CV: RRR, no MRG. No Carotid Bruits. No peripheral edema, warm, nontender Eyes: Conjunctivae clear without exudates or hemorrhage  Neuro: Detailed Neurologic Exam  Speech:    Speech is normal; fluent and spontaneous with normal comprehension.  Cognition: MMSE - Mini Mental State Exam 12/31/2015  Orientation to time 3  Orientation to Place 4  Registration 3  Attention/ Calculation 5  Recall 1  Language- name 2 objects 2  Language- repeat 1  Language- follow 3 step command 3  Language- read & follow direction 1  Write  a sentence 1  Copy design 1  Total score 25   Cranial Nerves:    The pupils are equal, round, and reactive to light. The fundi are normal and spontaneous venous pulsations are present. Visual fields are full to finger confrontation. Extraocular movements are intact. Trigeminal sensation is intact and the muscles of mastication are normal. The face is symmetric. The palate elevates in the midline. Hearing intact. Voice is normal. Shoulder shrug is normal. The tongue has normal motion without fasciculations.   Coordination:    No dysmetria  Gait:    Not ataxic  Motor Observation:    No asymmetry, no atrophy, and no involuntary movements noted. Tone:    Normal muscle tone.    Posture:    Posture is normal. normal erect    Strength:    Strength is V/V in the upper and lower limbs.      Sensation: intact to LT     Reflex Exam:  DTR's:    Deep tendon reflexes in the upper and lower extremities are brisk bilaterally.   Toes:    The toes are downgoing bilaterally.   Clonus:    Clonus is absent.       Assessment/Plan: 76 y.o. female here as a referral from Dr. Shelia Media for memory issues. PMHx fibromyalgia,  depression, insomnia, cardiac arrest due to vfib s/p AICD, acquired thyroid disease, chronic LBP, HLD.  /22/2005 after being found unresponsive. The down time was not clear, but could have been up to 15 minutes. She subsequently converted to ventricular fibrillation and in the course of resuscitation was shocked nine times. Ultimately, she remained in initial supraventricular rhythm and then sinus tachycardia. She was also intubated in the field and received Epinephrine, Atropine, and lidocaine. On arrival to [facility] the patient initially was unresponsive, but did have brainstem reflexes with decerebrate end corticate posturing upon arrival. The patient was seen by neurology. In the next ensuing days her mental function improved  -Neuropsychiatric testing -Per pcp notes, they are checking B12 and tsh and will defer to them on these labs. -Dementia vs hypoxemic brain injury. Family reports her memory problems improved after her hypoxic brain injury and memory has remained stable since then, I lean towards cognitive complaints due to her hypoxic brain injury 2ndary to cardiac arrest as opposed to a degenerative neurocognitive disorder at this time. There also may be a component of anxiety and depression. But given her age, early dementia is possible. Follow up: 4 months with carolyn and then 6 months later with me.  Sarina Ill, MD  [facility] [address] [place], [place] [address]  Phone [phone] Fax [phone]

## 2015-12-31 NOTE — Patient Instructions (Addendum)
Remember to drink plenty of fluid, eat healthy meals and do not skip any meals. Try to eat protein with a every meal and eat a healthy snack such as fruit or nuts in between meals. Try to keep a regular sleep-wake schedule and try to exercise daily, particularly in the form of walking, 20-30 minutes a day, if you can.   As far as your medications are concerned, I would like to suggest: None at this time, discucssed Aricept  As far as diagnostic testing: mri brain, neurocognitive testing  I would like to see you back in 3 months, sooner if we need to. Please call us with any interim questions, concerns, problems, updates or refill requests.   Our phone number is [phone]. We also have an after hours call service for urgent matters and there is a physician on-call for urgent questions. For any emergencies you know to call 911 or go to the nearest emergency room

## 2016-01-01 ENCOUNTER — Telehealth: Payer: Self-pay | Admitting: Neurology

## 2016-01-01 ENCOUNTER — Encounter (HOSPITAL_COMMUNITY): Payer: Self-pay | Admitting: Radiology

## 2016-01-01 DIAGNOSIS — R413 Other amnesia: Secondary | ICD-10-CM

## 2016-01-01 NOTE — Telephone Encounter (Signed)
Dr Jaynee Eagles- please advise. I am leaving at 1pm FYI. Thank you

## 2016-01-01 NOTE — Telephone Encounter (Signed)
Let daughter know we can proceed with CT of the head. It is not as sensitive as MRi but will give [place] information. Should also proceed with neurocognitive testing. Thanks. We will ensure she can have one with her defibrillator but don't think it will be a problem. Terrence Dupont, can you call [facility] and ask?

## 2016-01-01 NOTE — Telephone Encounter (Signed)
Placed new order to be done at hospital. [facility] per Dr Jaynee Eagles.  Called and spoke to [place]. Advised new order placed and she will have it done at [facility]. They should get a call to schedule. She verbalized understanding.  Spoke to [facility]. And she is going to take care of it.

## 2016-01-01 NOTE — Telephone Encounter (Signed)
Per Aniceto Boss, MRI Tech @ [facility] she spoke with [employer] and they advised her that her defibrillator is NOT MRI SAFE. Patient will not able to be scanned. Spoke with pt's daughter and she is now aware the MRI is not safe. Pt's daughter would like to know how to proceed. Please call and advise. # [phone]. Thanks!

## 2016-01-01 NOTE — Telephone Encounter (Signed)
Pt called said GI can't do the MRI because pt had a defibrillator. Sts is has to be done at the hospital. She can be also be reached at [phone] in addition to the home number

## 2016-01-03 ENCOUNTER — Encounter: Payer: Self-pay | Admitting: Neurology

## 2016-01-03 DIAGNOSIS — G931 Anoxic brain damage, not elsewhere classified: Secondary | ICD-10-CM | POA: Insufficient documentation

## 2016-01-03 DIAGNOSIS — R413 Other amnesia: Secondary | ICD-10-CM | POA: Insufficient documentation

## 2016-01-03 NOTE — Addendum Note (Signed)
Addended by: Sarina Ill B on: 01/03/2016 03:44 PM   Modules accepted: Orders

## 2016-01-04 NOTE — Telephone Encounter (Signed)
Called daughter. Advised per Dr Jaynee Eagles that she ordered CT instead. Daughter, Santiago Glad stated pt now does not want to have any testing done, including neurocognitive testing. She is going to speak to her mother this morning again and see if she is willing to complete tests. I highly encouraged that she complete testing. She understands and will call me back to advise on what the final decision is and what her mother wants to do.

## 2016-01-04 NOTE — Telephone Encounter (Signed)
Daughter Renitta Hemrick called back to advise patient is not interested in CT scan or Neuro Cognitive Testing.

## 2016-01-04 NOTE — Telephone Encounter (Addendum)
Called and spoke to [facility] imaging [place]) and they stated it would be fine for pt to have CT w/ a defibrillator.

## 2016-01-04 NOTE — Telephone Encounter (Signed)
Dr Ahern- FYI 

## 2016-01-11 ENCOUNTER — Encounter: Payer: Medicare Other | Admitting: *Deleted

## 2016-01-15 ENCOUNTER — Encounter: Payer: Self-pay | Admitting: Cardiology

## 2016-01-21 ENCOUNTER — Ambulatory Visit (INDEPENDENT_AMBULATORY_CARE_PROVIDER_SITE_OTHER): Payer: Medicare Other | Admitting: *Deleted

## 2016-01-21 DIAGNOSIS — I4901 Ventricular fibrillation: Secondary | ICD-10-CM

## 2016-01-21 DIAGNOSIS — Z4502 Encounter for adjustment and management of automatic implantable cardiac defibrillator: Secondary | ICD-10-CM

## 2016-01-21 DIAGNOSIS — I5032 Chronic diastolic (congestive) heart failure: Secondary | ICD-10-CM

## 2016-01-22 NOTE — Progress Notes (Signed)
Remote ICD transmission.   

## 2016-02-11 DIAGNOSIS — E041 Nontoxic single thyroid nodule: Secondary | ICD-10-CM | POA: Diagnosis not present

## 2016-02-16 ENCOUNTER — Ambulatory Visit
Admission: RE | Admit: 2016-02-16 | Discharge: 2016-02-16 | Disposition: A | Discharge status: Home / Self Care | Payer: Medicare Other | Source: Ambulatory Visit | Attending: Endocrinology | Admitting: Endocrinology

## 2016-02-16 DIAGNOSIS — E042 Nontoxic multinodular goiter: Secondary | ICD-10-CM | POA: Diagnosis not present

## 2016-02-16 DIAGNOSIS — E041 Nontoxic single thyroid nodule: Secondary | ICD-10-CM

## 2016-02-18 DIAGNOSIS — E041 Nontoxic single thyroid nodule: Secondary | ICD-10-CM | POA: Diagnosis not present

## 2016-02-19 LAB — CUP PACEART REMOTE DEVICE CHECK
Battery Remaining Longevity: 117 mo
Date Time Interrogation Session: 20170330200048
HIGH POWER IMPEDANCE MEASURED VALUE: 63 Ohm
Implantable Lead Implant Date: 20140328
Lead Channel Pacing Threshold Amplitude: 0.875 V
Lead Channel Pacing Threshold Pulse Width: 0.4 ms
Lead Channel Setting Pacing Pulse Width: 0.4 ms
Lead Channel Setting Sensing Sensitivity: 0.3 mV
MDC IDC LEAD LOCATION: 753860
MDC IDC LEAD MODEL: 6935
MDC IDC MSMT BATTERY VOLTAGE: 3.02 V
MDC IDC MSMT LEADCHNL RV IMPEDANCE VALUE: 418 Ohm
MDC IDC MSMT LEADCHNL RV IMPEDANCE VALUE: 551 Ohm
MDC IDC MSMT LEADCHNL RV SENSING INTR AMPL: 13.375 mV
MDC IDC MSMT LEADCHNL RV SENSING INTR AMPL: 13.375 mV
MDC IDC SET LEADCHNL RV PACING AMPLITUDE: 2.5 V
MDC IDC STAT BRADY RV PERCENT PACED: 0.01 %

## 2016-02-23 ENCOUNTER — Encounter: Payer: Self-pay | Admitting: Cardiology

## 2016-02-26 ENCOUNTER — Other Ambulatory Visit: Payer: Medicare Other

## 2016-03-30 ENCOUNTER — Encounter: Payer: Self-pay | Admitting: Internal Medicine

## 2016-04-20 ENCOUNTER — Encounter: Payer: Medicare Other | Admitting: Internal Medicine

## 2016-05-05 ENCOUNTER — Ambulatory Visit: Payer: Medicare Other | Admitting: Nurse Practitioner

## 2016-05-31 DIAGNOSIS — E781 Pure hyperglyceridemia: Secondary | ICD-10-CM | POA: Diagnosis not present

## 2016-05-31 DIAGNOSIS — M8580 Other specified disorders of bone density and structure, unspecified site: Secondary | ICD-10-CM | POA: Diagnosis not present

## 2016-06-07 DIAGNOSIS — Z1212 Encounter for screening for malignant neoplasm of rectum: Secondary | ICD-10-CM | POA: Diagnosis not present

## 2016-06-07 DIAGNOSIS — Z0001 Encounter for general adult medical examination with abnormal findings: Secondary | ICD-10-CM | POA: Diagnosis not present

## 2016-06-10 DIAGNOSIS — M810 Age-related osteoporosis without current pathological fracture: Secondary | ICD-10-CM | POA: Diagnosis not present

## 2016-06-21 ENCOUNTER — Encounter: Payer: Medicare Other | Admitting: Internal Medicine

## 2016-07-01 DIAGNOSIS — H25813 Combined forms of age-related cataract, bilateral: Secondary | ICD-10-CM | POA: Diagnosis not present

## 2016-11-02 ENCOUNTER — Encounter: Payer: Self-pay | Admitting: Internal Medicine

## 2017-01-06 ENCOUNTER — Encounter: Payer: Self-pay | Admitting: Cardiology

## 2017-01-24 ENCOUNTER — Encounter: Payer: Self-pay | Admitting: Internal Medicine

## 2017-01-24 ENCOUNTER — Ambulatory Visit (INDEPENDENT_AMBULATORY_CARE_PROVIDER_SITE_OTHER): Payer: Medicare Other | Admitting: Internal Medicine

## 2017-01-24 VITALS — BP 118/64 | HR 66 | Ht 63.5 in | Wt 149.2 lb

## 2017-01-24 DIAGNOSIS — I5032 Chronic diastolic (congestive) heart failure: Secondary | ICD-10-CM

## 2017-01-24 DIAGNOSIS — Z0000 Encounter for general adult medical examination without abnormal findings: Secondary | ICD-10-CM

## 2017-01-24 NOTE — Patient Instructions (Signed)
Medication Instructions:  Your physician recommends that you continue on your current medications as directed. Please refer to the Current Medication list given to you today.   Labwork: none  Testing/Procedures: none  Follow-Up: Remote monitoring is used to monitor your Pacemaker of ICD from home. This monitoring reduces the number of office visits required to check your device to one time per year. It allows [place] to keep an eye on the functioning of your device to ensure it is working properly. You are scheduled for a device check from home on 04/25/17. You may send your transmission at any time that day. If you have a wireless device, the transmission will be sent automatically. After your physician reviews your transmission, you will receive a postcard with your next transmission date.  Your physician wants you to follow-up in: 12 months with Dr. Lovena Le. You will receive a reminder letter in the mail two months in advance. If you don't receive a letter, please call our office to schedule the follow-up appointment.   Any Other Special Instructions Will Be Listed Below (If Applicable).     If you need a refill on your cardiac medications before your next appointment, please call your pharmacy.

## 2017-01-24 NOTE — Progress Notes (Signed)
HPI [name] returns today for followup. She is a pleasant 76 yo woman with an unexplained VF arrest, s/p rescucitation. In the interim, she denies chest pain, sob, or syncope. No ICD shock. She has improved from her hip fracture. She thinks that her memory is still not as good as it was before her VF arrest.   Allergies  Allergen Reactions  . Sulfonamide Derivatives Other (See Comments)    Blisters in mouth     Current Outpatient Prescriptions  Medication Sig Dispense Refill  . acetaminophen (TYLENOL) 325 MG tablet Take 2 tablets (650 mg total) by mouth every 6 (six) hours as needed. 60 tablet 0  . fish oil-omega-3 fatty acids 1000 MG capsule Take 1 g by mouth daily.    Marland Kitchen gabapentin (NEURONTIN) 300 MG capsule Take 300 mg by mouth 2 (two) times daily.    Marland Kitchen levothyroxine (SYNTHROID, LEVOTHROID) 112 MCG tablet Take 112 mcg by mouth daily before breakfast.    . Multiple Vitamins-Minerals (MULTIVITAMIN WITH MINERALS) tablet Take 1 tablet by mouth daily.    . sertraline (ZOLOFT) 100 MG tablet Take 100 mg by mouth daily.     No current facility-administered medications for this visit.      Past Medical History:  Diagnosis Date  . Arthritis   . Arthritis   . Cardiac defibrillator in place 2005  . Congestive heart failure ([place])   . Depression   . Fibromyalgia   . Gallbladder disease   . Osteoporosis   . Osteoporosis   . Thyroid disease   . Thyroid disease     ROS:   All systems reviewed and negative except as noted in the HPI.   Past Surgical History:  Procedure Laterality Date  . CARDIAC CATHETERIZATION  2005  . CARDIAC DEFIBRILLATOR PLACEMENT  12-23-2003   MDT single chamber ICD implanted by Dr Caryl Comes [phone] lead)  . COLONOSCOPY    . COLONOSCOPY  2010   Adenomatous with high grade dysplasia  . HIP ARTHROPLASTY Left 11/12/2014   Procedure: Left Hip Hemiarthroplasty;  Surgeon: Augustin Schooling, MD;  Location: [place] [facility];  Service: Orthopedics;  Laterality: Left;  . IMPLANTABLE  CARDIOVERTER DEFIBRILLATOR GENERATOR CHANGE  01-18-2013   MDT ICD gen change with insertion of new RV lead, capping of 6949 lead by Dr Lovena Le  . IMPLANTABLE CARDIOVERTER DEFIBRILLATOR GENERATOR CHANGE N/A 01/18/2013   Procedure: IMPLANTABLE CARDIOVERTER DEFIBRILLATOR GENERATOR CHANGE;  Surgeon: Evans Lance, MD;  Location: [facility] CATH LAB;  Service: Cardiovascular;  Laterality: N/A;  . LEAD REVISION N/A 01/18/2013   Procedure: LEAD REVISION;  Surgeon: Evans Lance, MD;  Location: [facility] CATH LAB;  Service: Cardiovascular;  Laterality: N/A;  . PACEMAKER INSERTION     NOT MRI SAFE  . POLYPECTOMY       Family History  Problem Relation Age of Onset  . Breast cancer Sister   . Seizures Neg Hx   . Dementia Neg Hx      Social History   Social History  . Marital status: Married    Spouse name: [name]  . Number of children: 2  . Years of education: 12   Occupational History  . Not on file.   Social History Main Topics  . Smoking status: Current Every Day Smoker    Packs/day: 0.50    Types: Cigarettes  . Smokeless tobacco: Never Used  . Alcohol use No  . Drug use: No  . Sexual activity: Not on file   Other Topics Concern  . Not  on file   Social History Narrative   Lives with husband, [name]   Caffeine use: 6 cups per day (coffee)     BP 118/64   Pulse 66   Ht 5' 3.5" (1.613 m)   Wt 149 lb 3.2 oz (67.7 kg)   SpO2 97%   BMI 26.02 kg/m   Physical Exam:  Well appearing 76 yo woman HEENT: Unremarkable Neck:  7 cm JVD, no thyromegally Lungs:  Clear with no wheezes, rales, or rhonchi HEART:  Regular rate rhythm, no murmurs, no rubs, no clicks Abd:  soft, positive bowel sounds, no organomegally, no rebound, no guarding Ext:  2 plus pulses, no edema, no cyanosis, no clubbing Skin:  No rashes no nodules Neuro:  CN II through XII intact, motor grossly intact  ECG - NSR  DEVICE  Normal device function.  See PaceArt for details.   Assess/Plan: 1. VF arrest - her device  interogation demonstrates no additional VT or VF. Will follow. 2. ICD - Her medtronic device is working normally. Will recheck in several months.  Mikle Bosworth.D.

## 2017-01-25 LAB — CUP PACEART INCLINIC DEVICE CHECK
Battery Voltage: 3.02 V
HIGH POWER IMPEDANCE MEASURED VALUE: 73 Ohm
Implantable Lead Implant Date: 20140328
Implantable Pulse Generator Implant Date: 20140328
Lead Channel Impedance Value: 532 Ohm
Lead Channel Pacing Threshold Amplitude: 0.75 V
Lead Channel Pacing Threshold Pulse Width: 0.4 ms
Lead Channel Sensing Intrinsic Amplitude: 15.5 mV
Lead Channel Sensing Intrinsic Amplitude: 17.875 mV
Lead Channel Setting Pacing Pulse Width: 0.4 ms
MDC IDC LEAD LOCATION: 753860
MDC IDC MSMT BATTERY REMAINING LONGEVITY: 105 mo
MDC IDC MSMT LEADCHNL RV IMPEDANCE VALUE: 418 Ohm
MDC IDC SESS DTM: 20180403152937
MDC IDC SET LEADCHNL RV PACING AMPLITUDE: 2.5 V
MDC IDC SET LEADCHNL RV SENSING SENSITIVITY: 0.3 mV
MDC IDC STAT BRADY RV PERCENT PACED: 0.01 %

## 2017-02-09 DIAGNOSIS — E041 Nontoxic single thyroid nodule: Secondary | ICD-10-CM | POA: Diagnosis not present

## 2017-02-16 DIAGNOSIS — E041 Nontoxic single thyroid nodule: Secondary | ICD-10-CM | POA: Diagnosis not present

## 2017-02-16 DIAGNOSIS — G629 Polyneuropathy, unspecified: Secondary | ICD-10-CM | POA: Diagnosis not present

## 2017-04-25 ENCOUNTER — Ambulatory Visit (INDEPENDENT_AMBULATORY_CARE_PROVIDER_SITE_OTHER): Payer: Medicare Other | Admitting: *Deleted

## 2017-04-25 ENCOUNTER — Telehealth: Payer: Self-pay | Admitting: Cardiology

## 2017-04-25 DIAGNOSIS — I4901 Ventricular fibrillation: Secondary | ICD-10-CM

## 2017-04-25 NOTE — Telephone Encounter (Signed)
Spoke with pt and reminded pt of remote transmission that is due today. Pt verbalized understanding.   

## 2017-04-25 NOTE — Progress Notes (Signed)
Remote ICD transmission.   

## 2017-04-28 ENCOUNTER — Encounter: Payer: Self-pay | Admitting: Cardiology

## 2017-04-28 LAB — CUP PACEART REMOTE DEVICE CHECK
Battery Remaining Longevity: 101 mo
Battery Voltage: 3.02 V
Brady Statistic RV Percent Paced: 0.01 %
Date Time Interrogation Session: 20180703163645
HIGH POWER IMPEDANCE MEASURED VALUE: 74 Ohm
Lead Channel Impedance Value: 361 Ohm
Lead Channel Pacing Threshold Amplitude: 0.875 V
Lead Channel Sensing Intrinsic Amplitude: 15.875 mV
Lead Channel Setting Pacing Pulse Width: 0.4 ms
MDC IDC LEAD IMPLANT DT: 20140328
MDC IDC LEAD LOCATION: 753860
MDC IDC MSMT LEADCHNL RV IMPEDANCE VALUE: 475 Ohm
MDC IDC MSMT LEADCHNL RV PACING THRESHOLD PULSEWIDTH: 0.4 ms
MDC IDC MSMT LEADCHNL RV SENSING INTR AMPL: 15.875 mV
MDC IDC PG IMPLANT DT: 20140328
MDC IDC SET LEADCHNL RV PACING AMPLITUDE: 2.5 V
MDC IDC SET LEADCHNL RV SENSING SENSITIVITY: 0.3 mV

## 2017-06-07 DIAGNOSIS — E781 Pure hyperglyceridemia: Secondary | ICD-10-CM | POA: Diagnosis not present

## 2017-06-07 DIAGNOSIS — Z0000 Encounter for general adult medical examination without abnormal findings: Secondary | ICD-10-CM | POA: Diagnosis not present

## 2017-06-07 DIAGNOSIS — N390 Urinary tract infection, site not specified: Secondary | ICD-10-CM | POA: Diagnosis not present

## 2017-06-07 DIAGNOSIS — M8580 Other specified disorders of bone density and structure, unspecified site: Secondary | ICD-10-CM | POA: Diagnosis not present

## 2017-06-07 DIAGNOSIS — E559 Vitamin D deficiency, unspecified: Secondary | ICD-10-CM | POA: Diagnosis not present

## 2017-06-07 DIAGNOSIS — K219 Gastro-esophageal reflux disease without esophagitis: Secondary | ICD-10-CM | POA: Diagnosis not present

## 2017-06-12 DIAGNOSIS — G4700 Insomnia, unspecified: Secondary | ICD-10-CM | POA: Diagnosis not present

## 2017-06-12 DIAGNOSIS — I5032 Chronic diastolic (congestive) heart failure: Secondary | ICD-10-CM | POA: Diagnosis not present

## 2017-06-12 DIAGNOSIS — M797 Fibromyalgia: Secondary | ICD-10-CM | POA: Diagnosis not present

## 2017-06-13 ENCOUNTER — Other Ambulatory Visit: Payer: Self-pay | Admitting: Internal Medicine

## 2017-06-13 DIAGNOSIS — M810 Age-related osteoporosis without current pathological fracture: Secondary | ICD-10-CM | POA: Diagnosis not present

## 2017-06-13 DIAGNOSIS — E041 Nontoxic single thyroid nodule: Secondary | ICD-10-CM

## 2017-06-16 ENCOUNTER — Other Ambulatory Visit: Payer: Medicare Other

## 2017-06-21 DIAGNOSIS — L821 Other seborrheic keratosis: Secondary | ICD-10-CM | POA: Diagnosis not present

## 2017-06-21 DIAGNOSIS — D1801 Hemangioma of skin and subcutaneous tissue: Secondary | ICD-10-CM | POA: Diagnosis not present

## 2017-07-06 ENCOUNTER — Other Ambulatory Visit: Payer: Medicare Other

## 2017-07-07 ENCOUNTER — Other Ambulatory Visit: Payer: Medicare Other

## 2017-07-12 DIAGNOSIS — L729 Follicular cyst of the skin and subcutaneous tissue, unspecified: Secondary | ICD-10-CM | POA: Diagnosis not present

## 2017-07-12 DIAGNOSIS — D485 Neoplasm of uncertain behavior of skin: Secondary | ICD-10-CM | POA: Diagnosis not present

## 2017-07-13 ENCOUNTER — Ambulatory Visit
Admission: RE | Admit: 2017-07-13 | Discharge: 2017-07-13 | Disposition: A | Discharge status: Home / Self Care | Payer: Medicare Other | Source: Ambulatory Visit | Attending: Internal Medicine | Admitting: Internal Medicine

## 2017-07-13 DIAGNOSIS — E042 Nontoxic multinodular goiter: Secondary | ICD-10-CM | POA: Diagnosis not present

## 2017-07-13 DIAGNOSIS — E041 Nontoxic single thyroid nodule: Secondary | ICD-10-CM

## 2017-07-25 ENCOUNTER — Telehealth: Payer: Self-pay | Admitting: Cardiology

## 2017-07-25 ENCOUNTER — Encounter: Payer: Medicare Other | Admitting: *Deleted

## 2017-07-25 NOTE — Telephone Encounter (Signed)
Spoke with pt and reminded pt of remote transmission that is due today. Pt verbalized understanding.   

## 2017-07-26 ENCOUNTER — Encounter: Payer: Self-pay | Admitting: Cardiology

## 2017-12-13 DIAGNOSIS — R413 Other amnesia: Secondary | ICD-10-CM | POA: Diagnosis not present

## 2017-12-13 DIAGNOSIS — I5032 Chronic diastolic (congestive) heart failure: Secondary | ICD-10-CM | POA: Diagnosis not present

## 2017-12-13 DIAGNOSIS — E041 Nontoxic single thyroid nodule: Secondary | ICD-10-CM | POA: Diagnosis not present

## 2018-02-12 ENCOUNTER — Telehealth: Payer: Self-pay | Admitting: Neurology

## 2018-02-12 ENCOUNTER — Encounter: Payer: Self-pay | Admitting: Neurology

## 2018-02-12 ENCOUNTER — Ambulatory Visit: Payer: Medicare Other | Admitting: Neurology

## 2018-02-12 VITALS — BP 123/68 | HR 65 | Ht 63.5 in | Wt 148.0 lb

## 2018-02-12 DIAGNOSIS — G309 Alzheimer's disease, unspecified: Secondary | ICD-10-CM

## 2018-02-12 DIAGNOSIS — F0390 Unspecified dementia without behavioral disturbance: Secondary | ICD-10-CM | POA: Diagnosis not present

## 2018-02-12 NOTE — Telephone Encounter (Signed)
[employer] Medicare auth: NPR . Patient wants to go to [place]. Helen with [facility] which is with [facility] will reach out to the patient to schedule.

## 2018-02-12 NOTE — Progress Notes (Signed)
[facility]    Provider:  Dr Jaynee Eagles Referring Provider: Deland Pretty, MD Primary Care Physician:  Deland Pretty, MD  CC:  Memory issues  Interval history 02/12/2018: memory problems are worsening, repeating things, daughter provides most information, memory is worsening. PMHx fibromyalgia, depression, insomnia, cardiac arrest due to vfib s/p AICD, acquired thyroid disease, chronic LBP, HLD, CHF, recurrent depression, memory loss. She does not know why she is on neurontin, she has not had fibromyalgia pain in a few years. She forgets conversations, she forgets as soon as she hangs up. She repeats things in the same time frame, a minute later she asks again and asks 3-4 times, She doesn't know why her husband does the bills, daughter said she was forgetting to pay things. She does not drive, she loses things and gets distracted easily. She does not forget her medications, she has a pill dispenser. No recent accidents in the home, in 2018 she left a pot on the stove. She is not cooking, she has difficulty remembering what goes into recipes.   HPI:  [name] is a 77 y.o. female here as a referral from Dr. Shelia Media for memory issues. PMHx fibromyalgia, depression, insomnia, cardiac arrest due to vfib s/p AICD, acquired thyroid disease, chronic LBP, HLD.  In February of 2005 she had cardiac arrest, oxygen loss and she was declared brain dead at the hospital and the family was deciding to take off of life support. She had a cardiac defibrillator placed.  Ever since then she has had memory issues. Memory improved gradually after that but still not back to beforehand. More short-term memory loss. She has not declined in memory. Daughter is here and provides information. Patient can't remember what she read in the paper today. Having difficulty retaining new memories like who she spoke to earlier. She also can't remember things in the past like neighbors names. Lives with her husband, she  takes care of the bills, haven't missed any bills, home is well cared for, she drives and she gets lost a little bit. She gets confused with memories. She worries too much about things. No increased confusion in the evening. Patient says she has a hard time thinking and sometimes expressing herslef. No hallucinations or delusion.    Reviewed notes, labs and imaging from outside physicians, which showed: B12 and TSH checked at pcp.   Admitted 12/16/2003 after being found unresponsive. The down time was not clear, but could have been up to 15 minutes. She subsequently converted to ventricular fibrillation and in the course of resuscitation was shocked nine times. Ultimately, she remained in initial supraventricular rhythm and then sinus tachycardia. She was also intubated in the field and received Epinephrine, Atropine, and lidocaine. On arrival to [facility] the patient initially was unresponsive, but did have brainstem reflexes with decerebrate end corticate posturing upon arrival. The patient was seen by neurology. In the next ensuing days her mental function improved  CT of the head 12/16/2003: personally reviewed images and agree with the following   CT HEAD WITHOUT CONTRAST  Routine non-contrast head CT was performed. There is no evidence of intracranial hemorrhage, brain edema, or mass effect. The ventricles are normal. No extra-axial abnormalities are identified. Bone windows show no significant abnormalities.   IMPRESSION  Negative non-contrast head CT.   12/16/2015: CBC and bmp unremarkable.Per pcp notes, they are checking B12 and tsh and will defer to them on these labs.   Review of Systems: Patient complains of symptoms per  HPI as well as the following symptoms: memory loss, confusion, depression. Pertinent negatives per HPI. All others negative.   Social History   Socioeconomic History  . Marital status: Married    Spouse name: [name]  . Number of children: 2    . Years of education: 76  . Highest education level: Not on file  Occupational History  . Not on file  Social Needs  . Financial resource strain: Not on file  . Food insecurity:    Worry: Not on file    Inability: Not on file  . Transportation needs:    Medical: Not on file    Non-medical: Not on file  Tobacco Use  . Smoking status: Current Every Day Smoker    Packs/day: 0.20    Types: Cigarettes  . Smokeless tobacco: Never Used  Substance and Sexual Activity  . Alcohol use: No    Alcohol/week: 0.0 oz  . Drug use: No  . Sexual activity: Not on file  Lifestyle  . Physical activity:    Days per week: Not on file    Minutes per session: Not on file  . Stress: Not on file  Relationships  . Social connections:    Talks on phone: Not on file    Gets together: Not on file    Attends religious service: Not on file    Active member of club or organization: Not on file    Attends meetings of clubs or organizations: Not on file    Relationship status: Not on file  . Intimate partner violence:    Fear of current or ex partner: Not on file    Emotionally abused: Not on file    Physically abused: Not on file    Forced sexual activity: Not on file  Other Topics Concern  . Not on file  Social History Narrative   Lives with husband, [name] & grandson also lives with them   Caffeine use: 6 cups per day (coffee)   Right handed    Family History  Problem Relation Age of Onset  . Breast cancer Sister   . Seizures Neg Hx   . Dementia Neg Hx     Past Medical History:  Diagnosis Date  . Arthritis   . Arthritis   . Cardiac defibrillator in place 2005  . Congestive heart failure ([place])   . Depression   . Fibromyalgia   . Gallbladder disease   . Osteoporosis   . Osteoporosis   . Thyroid disease   . Thyroid disease     Past Surgical History:  Procedure Laterality Date  . CARDIAC CATHETERIZATION  2005  . CARDIAC DEFIBRILLATOR PLACEMENT  12-23-2003   MDT single chamber ICD  implanted by Dr Caryl Comes [phone] lead)  . COLONOSCOPY    . COLONOSCOPY  2010   Adenomatous with high grade dysplasia  . HIP ARTHROPLASTY Left 11/12/2014   Procedure: Left Hip Hemiarthroplasty;  Surgeon: Augustin Schooling, MD;  Location: [place];  Service: Orthopedics;  Laterality: Left;  . IMPLANTABLE CARDIOVERTER DEFIBRILLATOR GENERATOR CHANGE  01-18-2013   MDT ICD gen change with insertion of new RV lead, capping of 6949 lead by Dr Lovena Le  . IMPLANTABLE CARDIOVERTER DEFIBRILLATOR GENERATOR CHANGE N/A 01/18/2013   Procedure: IMPLANTABLE CARDIOVERTER DEFIBRILLATOR GENERATOR CHANGE;  Surgeon: Evans Lance, MD;  Location[facility] C[facility] LAB;  Service: Cardiovascular;  Laterality: N/A;  . LEAD REVISION N/A 01/18/2013   Procedure: LEAD REVISION;  Surgeon: Evans Lance, MD;  Location: [facility] CATH LAB;  Service: Cardiovascular;  Laterality: N/A;  . PACEMAKER INSERTION     NOT MRI SAFE  . POLYPECTOMY      Current Outpatient Medications  Medication Sig Dispense Refill  . gabapentin (NEURONTIN) 300 MG capsule Take 300 mg by mouth daily.     Marland Kitchen levothyroxine (SYNTHROID, LEVOTHROID) 100 MCG tablet Take 100 mcg by mouth daily before breakfast.    . Naproxen Sodium (ALEVE PO) Take by mouth as needed.    . sertraline (ZOLOFT) 100 MG tablet Take 100 mg by mouth daily.    Marland Kitchen acetaminophen (TYLENOL) 325 MG tablet Take 2 tablets (650 mg total) by mouth every 6 (six) hours as needed. (Patient not taking: Reported on 02/12/2018) 60 tablet 0   No current facility-administered medications for this visit.     Allergies as of 02/12/2018 - Review Complete 02/12/2018  Allergen Reaction Noted  . Sulfonamide derivatives Other (See Comments)     Vitals: BP 123/68 (BP Location: Right Arm, Patient Position: Sitting)   Pulse 65   Ht 5' 3.5" (1.613 m)   Wt 148 lb (67.1 kg)   BMI 25.81 kg/m  Last Weight:  Wt Readings from Last 1 Encounters:  02/12/18 148 lb (67.1 kg)   Last Height:   Ht Readings from Last 1 Encounters:    02/12/18 5' 3.5" (1.613 m)   Physical exam: Exam: Gen: NAD, conversant, well nourised, well groomed                     CV: RRR, no MRG. No Carotid Bruits. No peripheral edema, warm, nontender Eyes: Conjunctivae clear without exudates or hemorrhage  Neuro: Detailed Neurologic Exam  Speech:    Speech is normal; fluent and spontaneous with normal comprehension.  Cognition: MMSE - Mini Mental State Exam 02/12/2018 12/31/2015  Orientation to time 3 3  Orientation to Place 4 4  Registration 3 3  Attention/ Calculation 5 5  Recall 0 1  Language- name 2 objects 2 2  Language- repeat 1 1  Language- follow 3 step command 3 3  Language- read & follow direction 1 1  Write a sentence 1 1  Copy design 0 1  Total score 23 25   Cranial Nerves:    The pupils are equal, round, and reactive to light. The fundi are normal and spontaneous venous pulsations are present. Visual fields are full to finger confrontation. Extraocular movements are intact. Trigeminal sensation is intact and the muscles of mastication are normal. The face is symmetric. The palate elevates in the midline. Hearing intact. Voice is normal. Shoulder shrug is normal. The tongue has normal motion without fasciculations.   Coordination:    No dysmetria  Gait:    Not ataxic  Motor Observation:    No asymmetry, no atrophy, and no involuntary movements noted. Tone:    Normal muscle tone.    Posture:    Posture is normal. normal erect    Strength:    Strength is V/V in the upper and lower limbs.      Sensation: intact to LT     Reflex Exam:  DTR's:    Deep tendon reflexes in the upper and lower extremities are brisk bilaterally.   Toes:    The toes are downgoing bilaterally.   Clonus:    Clonus is absent.       Assessment/Plan: 77 y.o. female here as a referral from Dr. Shelia Media for memory issues. PMHx fibromyalgia, depression, insomnia, cardiac arrest due to vfib s/p AICD,  acquired thyroid disease, chronic LBP,  HLD.  /22/2005 after being found unresponsive. The down time was not clear, but could have been up to 15 minutes. She subsequently converted to ventricular fibrillation and in the course of resuscitation was shocked nine times. Ultimately, she remained in initial supraventricular rhythm and then sinus tachycardia. She was also intubated in the field and received Epinephrine, Atropine, and lidocaine. On arrival to [facility] the patient initially was unresponsive, but did have brainstem reflexes with decerebrate end corticate posturing upon arrival. The patient was seen by neurology. In the next ensuing days her mental function improved but since then has progressively and slowely declined likely demenita.  - Stop gabapentin - Neuropsychiatric testing - CT head [facility]) - FDG PET Scan - Check b12 and Homocysteine -Per pcp notes, they are checking tsh and will defer to this  Orders Placed This Encounter  Procedures  . CT HEAD WO CONTRAST  . NM PET Metabolic Brain  . B12 and Folate Panel  . Methylmalonic acid, serum  . Homocysteine  . Comprehensive metabolic panel  . CBC  . Ambulatory referral to Neuropsychology     -Dementia vs hypoxemic brain injury. Family reports her memory problems improved after her hypoxic brain injury but memory has declined since then, I lean towards cognitive complaints due to a neurodegenerative disorder in addition to hypoxemic injury  2ndary to cardiac arrest . There also may be a component of anxiety and depression.  Orders Placed This Encounter  Procedures  . CT HEAD WO CONTRAST  . NM PET Metabolic Brain  . B12 and Folate Panel  . Methylmalonic acid, serum  . Homocysteine  . Comprehensive metabolic panel  . CBC  . Ambulatory referral to Neuropsychology     Sarina Ill, MD  [facility] [address] [place], [place] [address]  Phone [phone] Fax [phone]  A total of 45  minutes was spent face-to-face with this patient. Over half this time was spent on counseling patient on the dementia diagnosis and different diagnostic and therapeutic options, counseling and coordination of care, risks ans benefits of management, compliance, or risk factor reduction and education.

## 2018-02-12 NOTE — Patient Instructions (Addendum)
Stop gabapentin - Neuropsychiatric testing Dr. Bonita Quin - CT head [facility]) - FDG PET Scan - see handout - Check b12 and Homocysteine today -labs - MIND diet from [facility]

## 2018-02-14 ENCOUNTER — Telehealth: Payer: Self-pay | Admitting: *Deleted

## 2018-02-14 NOTE — Telephone Encounter (Signed)
-----   Message from Melvenia Beam, MD sent at 02/14/2018 12:38 PM EDT ----- Labs normal

## 2018-02-14 NOTE — Telephone Encounter (Signed)
Called pt & LVM (ok per DPR) informing pt that her labs are normal. Left office number in message in case patient had any questions but informed her that a call back was not required.

## 2018-02-15 LAB — CBC
HEMATOCRIT: 45 % (ref 34.0–46.6)
Hemoglobin: 14.9 g/dL (ref 11.1–15.9)
MCH: 30.2 pg (ref 26.6–33.0)
MCHC: 33.1 g/dL (ref 31.5–35.7)
MCV: 91 fL (ref 79–97)
PLATELETS: 270 10*3/uL (ref 150–379)
RBC: 4.93 x10E6/uL (ref 3.77–5.28)
RDW: 13.1 % (ref 12.3–15.4)
WBC: 5.7 10*3/uL (ref 3.4–10.8)

## 2018-02-15 LAB — COMPREHENSIVE METABOLIC PANEL
ALK PHOS: 52 IU/L (ref 39–117)
ALT: 11 IU/L (ref 0–32)
AST: 11 IU/L (ref 0–40)
Albumin/Globulin Ratio: 1.8 (ref 1.2–2.2)
Albumin: 4.5 g/dL (ref 3.5–4.8)
BUN/Creatinine Ratio: 13 (ref 12–28)
BUN: 11 mg/dL (ref 8–27)
Bilirubin Total: 0.2 mg/dL (ref 0.0–1.2)
CO2: 23 mmol/L (ref 20–29)
CREATININE: 0.82 mg/dL (ref 0.57–1.00)
Calcium: 9.7 mg/dL (ref 8.7–10.3)
Chloride: 102 mmol/L (ref 96–106)
GFR calc Af Amer: 81 mL/min/{1.73_m2} (ref >59)
GFR calc non Af Amer: 70 mL/min/{1.73_m2} (ref >59)
GLUCOSE: 88 mg/dL (ref 65–99)
Globulin, Total: 2.5 g/dL (ref 1.5–4.5)
Potassium: 5.1 mmol/L (ref 3.5–5.2)
SODIUM: 141 mmol/L (ref 134–144)
Total Protein: 7 g/dL (ref 6.0–8.5)

## 2018-02-15 LAB — METHYLMALONIC ACID, SERUM: METHYLMALONIC ACID: 153 nmol/L (ref 0–378)

## 2018-02-15 LAB — HOMOCYSTEINE: Homocysteine: 8.7 umol/L (ref 0.0–15.0)

## 2018-02-15 LAB — B12 AND FOLATE PANEL
Folate: >20 ng/mL (ref >3.0)
Vitamin B-12: 702 pg/mL (ref 232–1245)

## 2018-02-16 ENCOUNTER — Telehealth: Payer: Self-pay | Admitting: Neurology

## 2018-02-16 NOTE — Telephone Encounter (Signed)
I just got off the phone with [facility] and I informed Bonnita Nasuti to contact her daughter to schedule. She will call her to schedule.

## 2018-02-16 NOTE — Telephone Encounter (Signed)
She has dementia, did they call her daughter? Wpuld you call daughter should be on [place] and let he rknow.Marland KitchenMarland Kitchen

## 2018-02-16 NOTE — Telephone Encounter (Signed)
[name]/[name] - I ordered an FDG PET scan on patient. This is the one for dementia, just wanted to make sure you knew exactly which one I wanted again it is the FDG PET scan.   Also, can her CT head be done in [place]? Is [facility] out there? Thanks!

## 2018-02-16 NOTE — Telephone Encounter (Signed)
Yes she can have her CT in [place]. They have tried to reach out to her twice and left her a voicemail as well.

## 2018-02-19 ENCOUNTER — Ambulatory Visit (INDEPENDENT_AMBULATORY_CARE_PROVIDER_SITE_OTHER): Payer: Medicare Other

## 2018-02-19 DIAGNOSIS — R413 Other amnesia: Secondary | ICD-10-CM

## 2018-02-19 DIAGNOSIS — G309 Alzheimer's disease, unspecified: Secondary | ICD-10-CM

## 2018-02-19 NOTE — Telephone Encounter (Signed)
Patient is scheduled today 02/19/18 to get her CT done at [facility].

## 2018-02-19 NOTE — Telephone Encounter (Signed)
Patient has tohave PET scan at [facility] apt is Monday May 6 th at 12:30 1st floor radiology .  I have spoke to Santiago Glad patient's daughter she is aware of all details.

## 2018-02-20 ENCOUNTER — Telehealth: Payer: Self-pay

## 2018-02-20 NOTE — Telephone Encounter (Signed)
-----   Message from Melvenia Beam, MD sent at 02/20/2018 10:06 AM EDT ----- Unremarkable CT of the head. Will await the FDG Pet scan for further information thanks

## 2018-02-20 NOTE — Telephone Encounter (Signed)
I called pt, advised her of the unremarkable CT head result. I advised her that we will call for the PET scans results when they become available. Pt verbalized understanding of results. I reminded pt of PET scan appt date and time. Pt's last DPR with [place] [facility] does not have anyone listed, so I cannot call these results to her family members at this time.

## 2018-02-21 ENCOUNTER — Encounter: Payer: Self-pay | Admitting: Psychology

## 2018-02-23 NOTE — Telephone Encounter (Addendum)
Kylie from the [place] who stated this patients PET scan needed auth. The patient has an apt on Monday afternoon. I spoke with [facility] who said they would leave the patients apt on the schedule. Authorization will need to be obtained Monday morning.   [employer] auth number-[id number]

## 2018-02-26 ENCOUNTER — Encounter (HOSPITAL_COMMUNITY): Payer: Medicare Other

## 2018-02-27 ENCOUNTER — Telehealth: Payer: Self-pay | Admitting: Neurology

## 2018-02-27 NOTE — Telephone Encounter (Signed)
I called [employer] medicare to start the case for the PET scan. They informed me that [name] had already started a case yesterday 02/26/18. The case number is [id number]. I did fax clinical notes it is still pending.

## 2018-03-02 NOTE — Telephone Encounter (Signed)
[employer] medicare did not approve the PET and clinical notes were submitted.   "Based on [place] imaging guidelines section: HD 8.2 Dementia-PET, we are unable to approve the requested procedue. Your records show that you have dementia or other related problems. These problems involve loss of memory that progresses over time. Your records also show a request for a PET detailed picture study, scan. Research has not proven that PET scans are helpful for this type of problem."   There is an option to do a peer to peer. The phone number is [phone] select option 3 and the case number is [id number]. The case does close on 03/07/18.

## 2018-03-05 NOTE — Telephone Encounter (Signed)
Dr. Jaynee Eagles Peer peer is scheduled today at [employer] company will call my telephone line. You will need this case number. The case number is [id number] . Thanks Hinton Dyer.

## 2018-03-05 NOTE — Telephone Encounter (Signed)
Unfortunately it was declined. At next appointment will try again.

## 2018-03-12 NOTE — Telephone Encounter (Signed)
Insurance did not approve PET scan Peer to peer was done buy Dr. Jaynee Eagles.

## 2018-06-18 ENCOUNTER — Encounter: Payer: Self-pay | Admitting: Neurology

## 2018-06-18 ENCOUNTER — Ambulatory Visit: Payer: Medicare Other | Admitting: Neurology

## 2018-06-18 ENCOUNTER — Telehealth: Payer: Self-pay | Admitting: *Deleted

## 2018-06-18 NOTE — Telephone Encounter (Signed)
Pt no showed her f/u appt on 06/18/2018 @ 11:00.

## 2018-07-30 ENCOUNTER — Encounter: Payer: Medicare Other | Admitting: Psychology

## 2018-08-20 ENCOUNTER — Encounter: Payer: Medicare Other | Admitting: Psychology

## 2018-12-05 IMAGING — CT CT HEAD W/O CM
3 series · 16 of 47 positions shown, 19 images · non-contrast
Comparison: 12/16/2003 CT head.

CLINICAL DATA: 76 y/o  F; memory loss.

EXAM:
CT HEAD WITHOUT CONTRAST
TECHNIQUE: Contiguous axial images were obtained from the base of the skull
through the vertex without intravenous contrast.

[Series 3: head wo · axial · 0.40mm/px · z∈[-196,-66]mm · 10 of 32 slices shown, 13 images]
[im 3/32  brain]
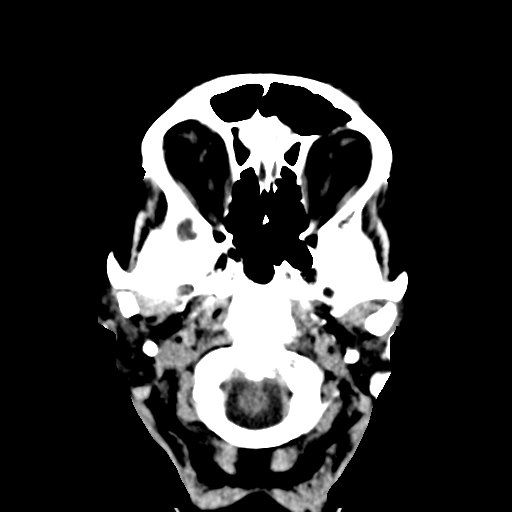
[im 3/32  bone]
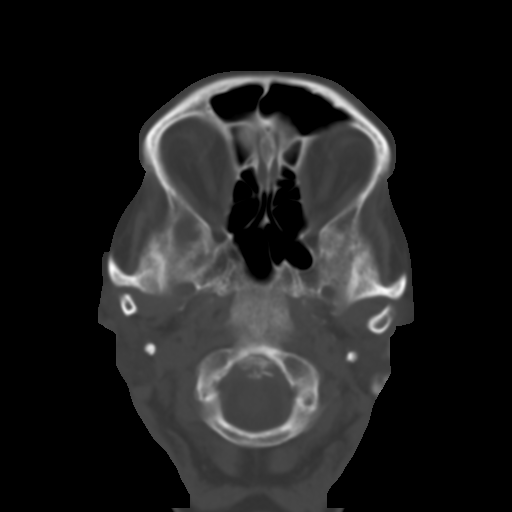
[im 6/32  brain]
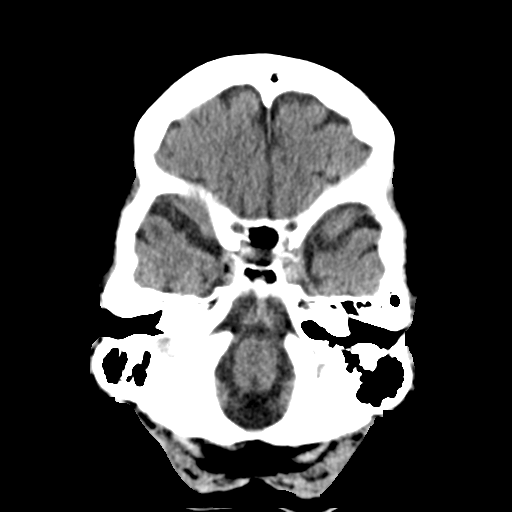
[im 9/32  brain]
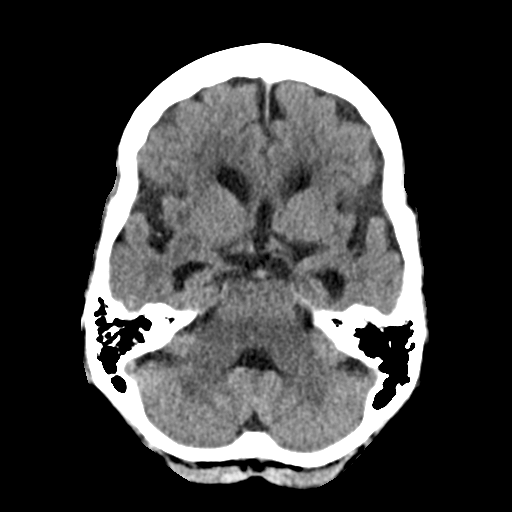
[im 11/32  brain]
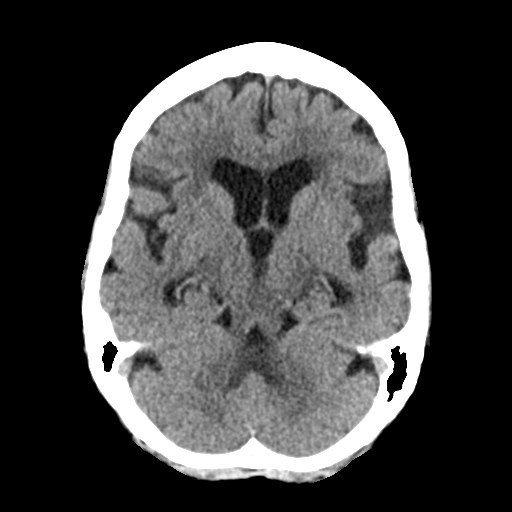
[im 14/32  brain]
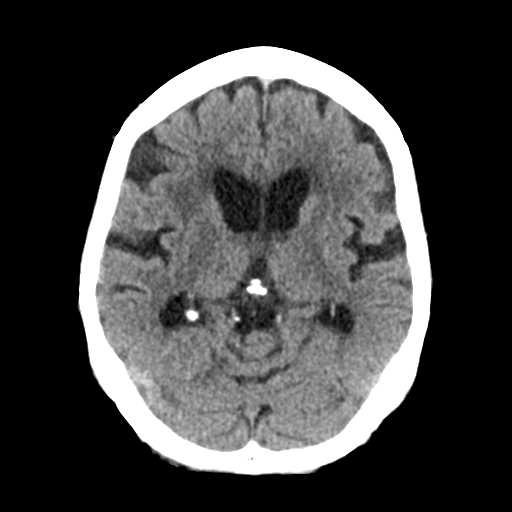
[im 14/32  bone]
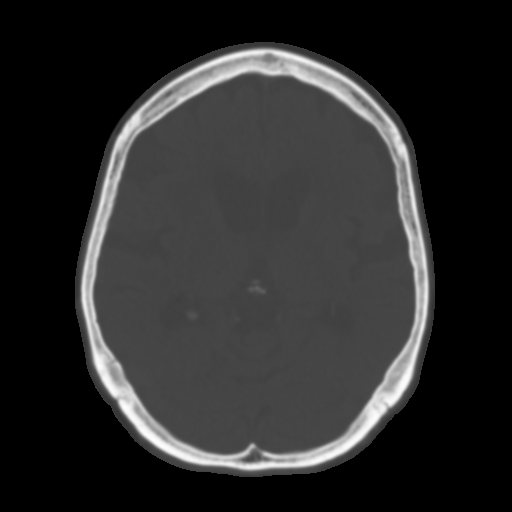
[im 18/32  brain]
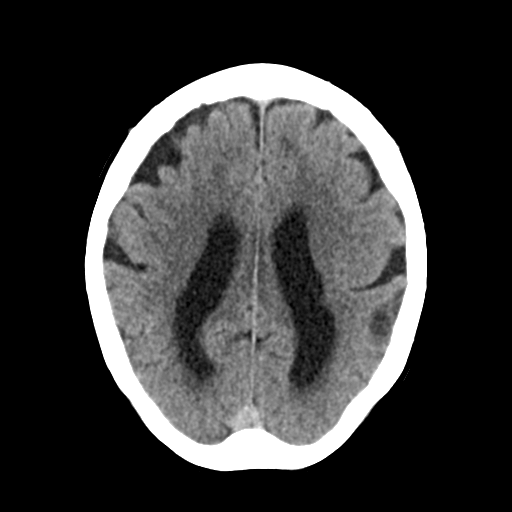
[im 21/32  brain]
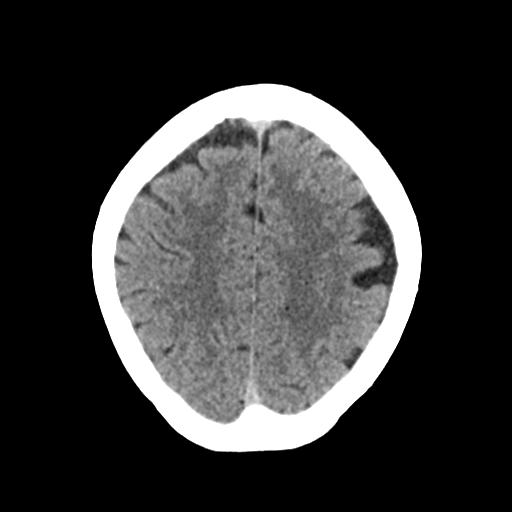
[im 24/32  brain]
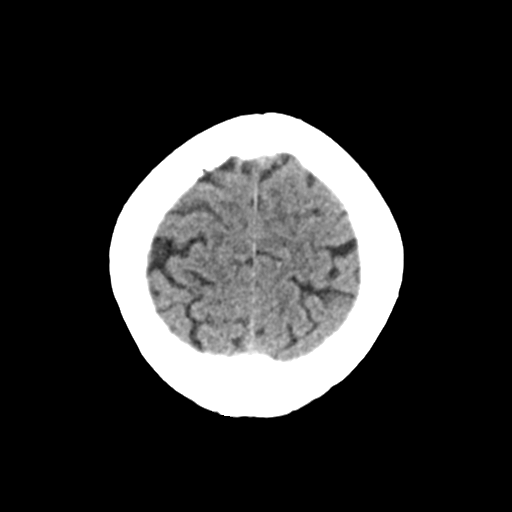
[im 26/32  brain]
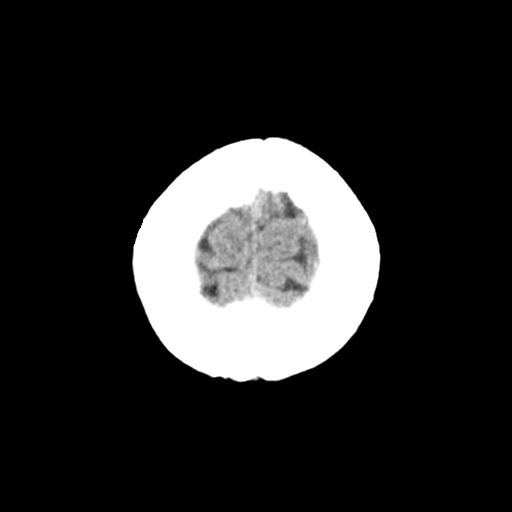
[im 26/32  bone]
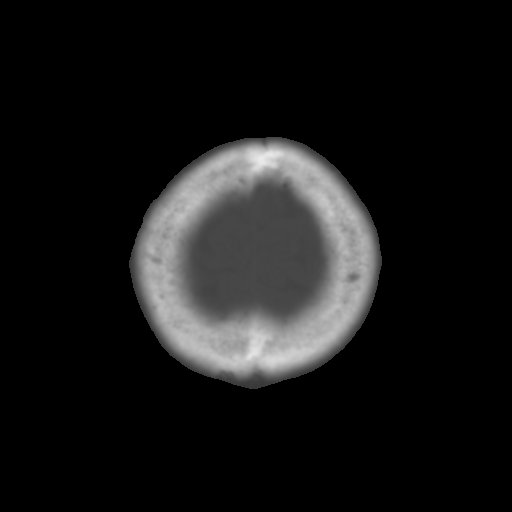
[im 29/32  brain]
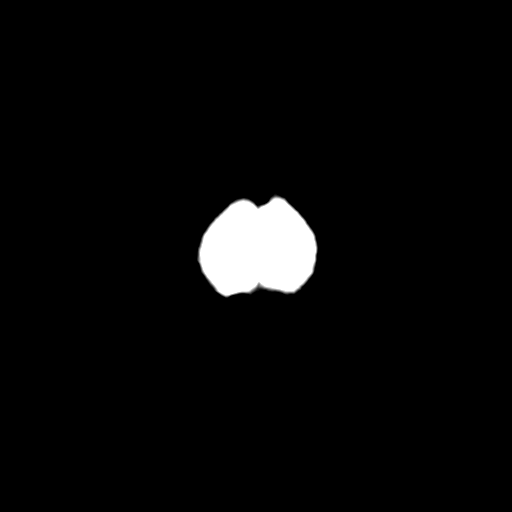

[Series 4: head wo coronal · coronal · 0.31mm/px · 3 of 65 slices shown]
[im 22/65  brain]
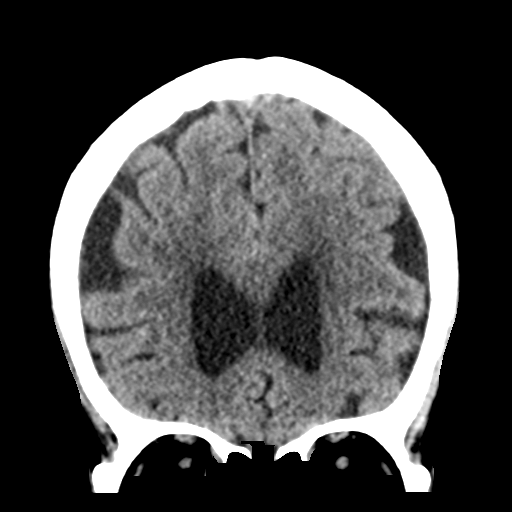
[im 29/65  brain]
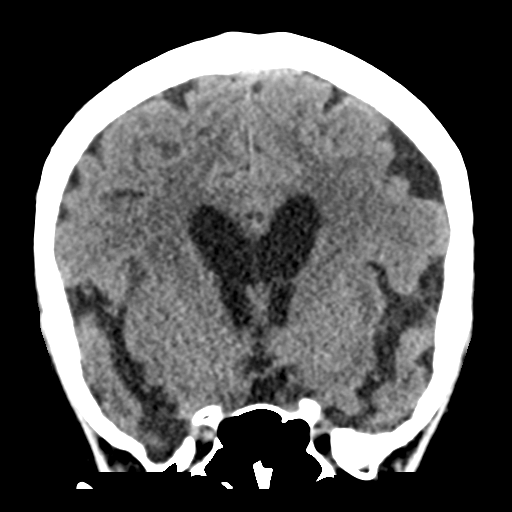
[im 36/65  brain]
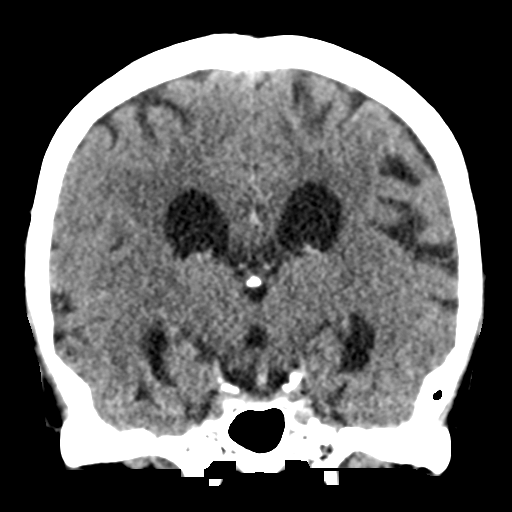

[Series 5: head wo sagittal · sagittal · 0.31mm/px · 3 of 57 slices shown]
[im 19/57  brain]
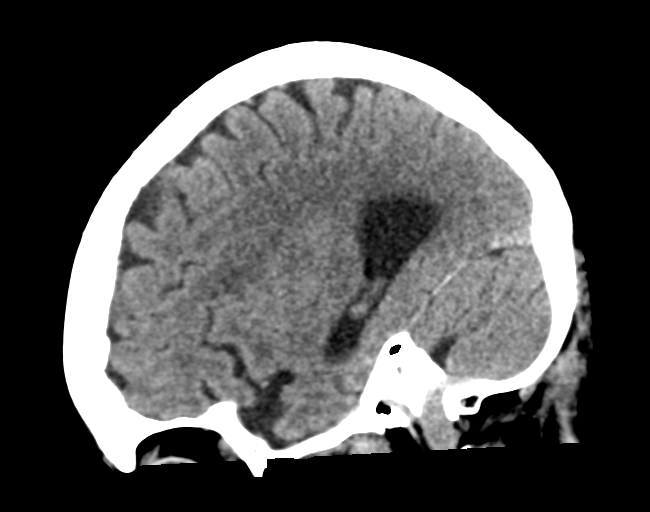
[im 29/57  brain]
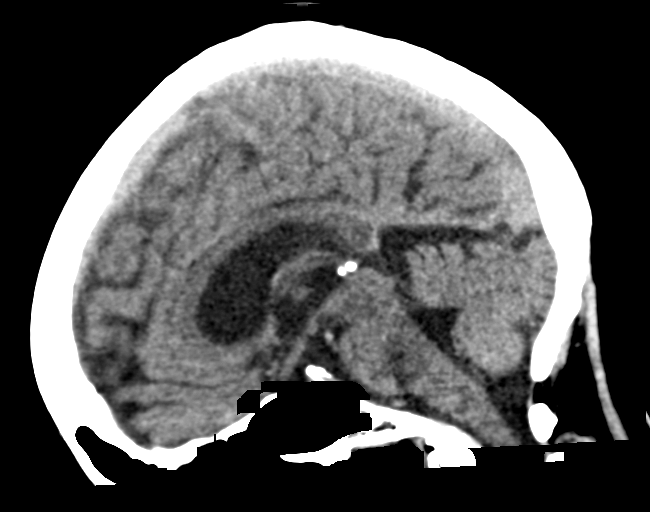
[im 38/57  brain]
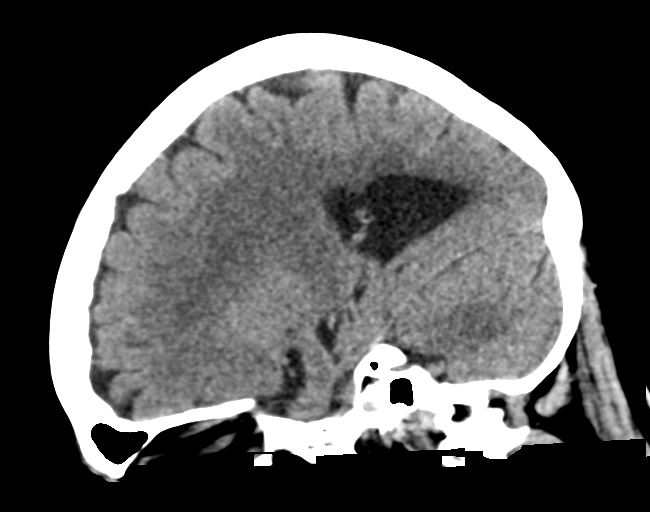

[16 of 47 positions shown; findings below may reference images not displayed]

FINDINGS: Brain: No evidence of acute infarction, hemorrhage, hydrocephalus,
extra-axial collection or mass lesion/mass effect. Nonspecific foci
of hypoattenuation in subcortical and periventricular white matter
are compatible with mild chronic microvascular ischemic changes and
there is moderate brain parenchymal volume loss new from 3225.

Vascular: No hyperdense vessel or unexpected calcification.

Skull: Normal. Negative for fracture or focal lesion.

Sinuses/Orbits: No acute finding.

Other: None.
IMPRESSION: 1. No acute intracranial abnormality identified.
2. Mild chronic microvascular ischemic changes and moderate
parenchymal volume loss of the brain from 3225.

By: [employer] M.D.

## 2019-02-20 ENCOUNTER — Encounter: Payer: Medicare Other | Admitting: *Deleted

## 2019-02-20 ENCOUNTER — Other Ambulatory Visit: Payer: Self-pay

## 2019-05-10 IMAGING — US US THYROID
1 series · 13 of 25 positions shown · non-contrast
Comparison: 02/16/2016; 07/24/2012 ; left-sided thyroid nodule
biopsy - 12/02/2008

CLINICAL DATA: Prior ultrasound follow-up. Follow-up thyroid

EXAM:
THYROID ULTRASOUND
TECHNIQUE: Ultrasound examination of the thyroid gland and adjacent soft
tissues was performed.

[Series 1: us thyroid · 0.04mm/px · 13 of 54 slices shown]
[im 1/54]
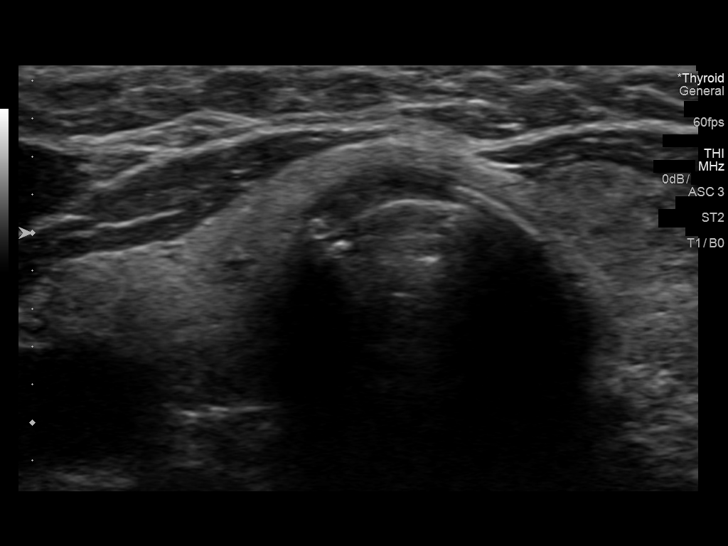
[im 5/54]
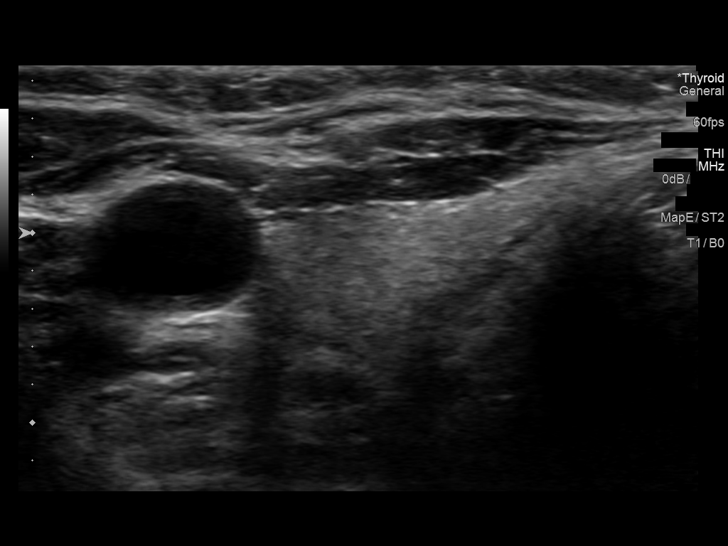
[im 9/54]
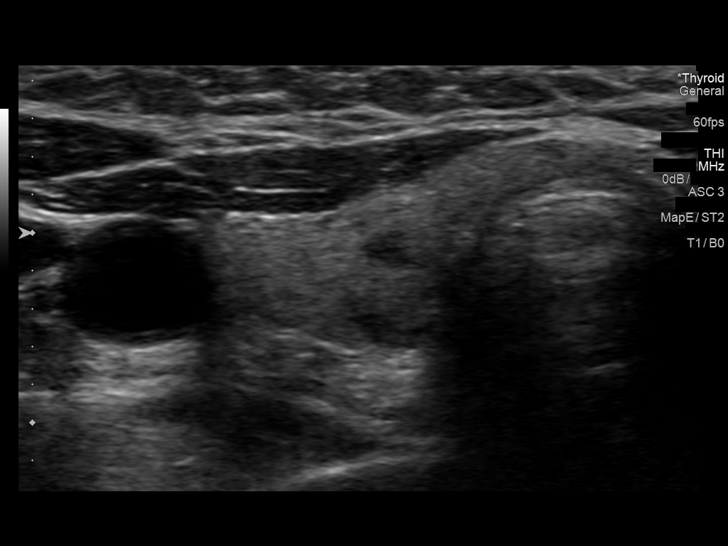
[im 14/54]
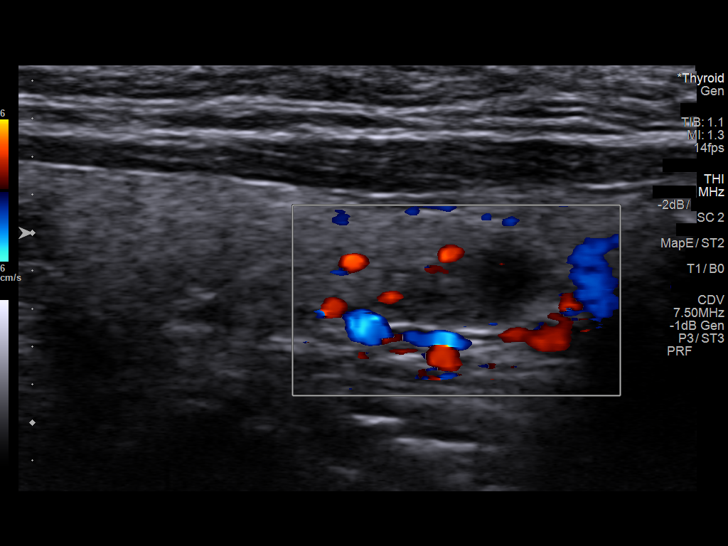
[im 18/54]
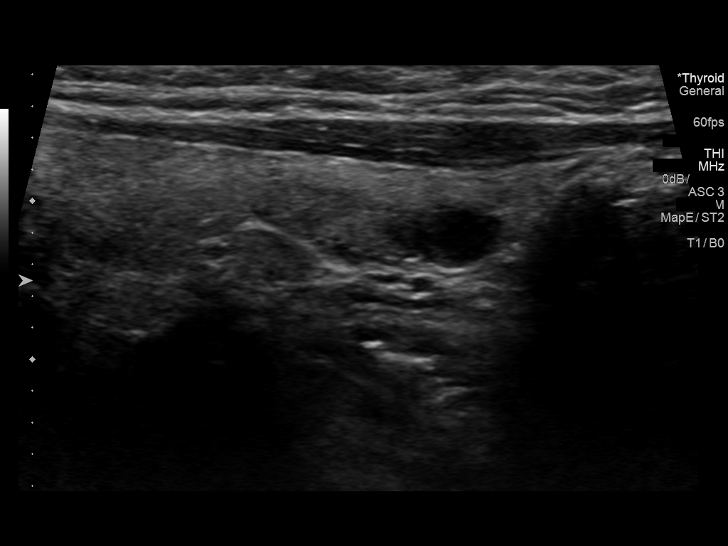
[im 23/54]
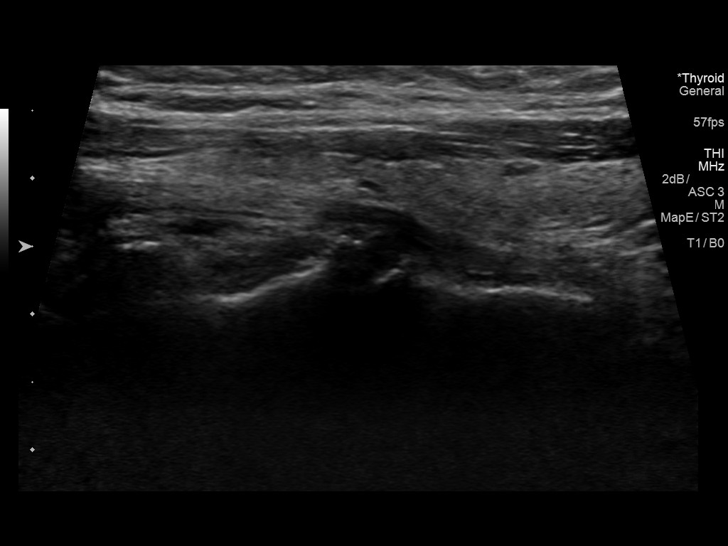
[im 27/54]
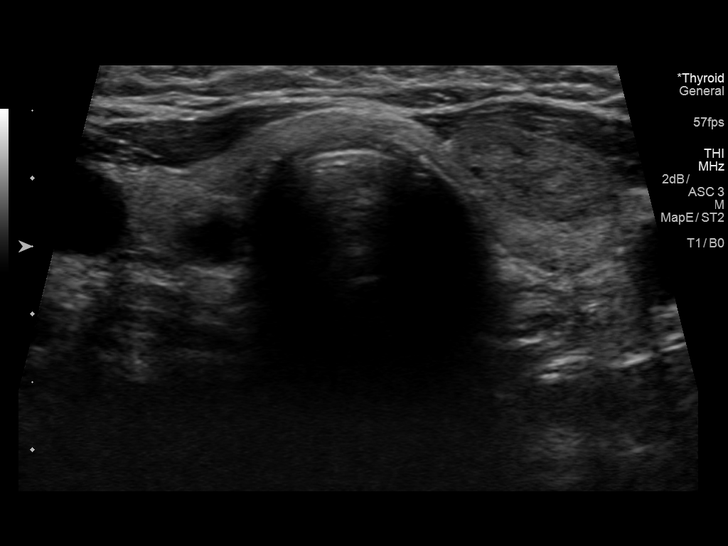
[im 31/54]
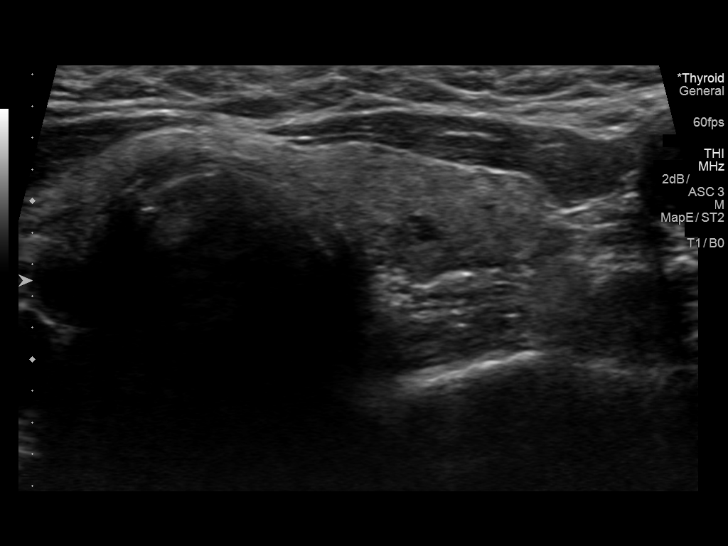
[im 36/54]
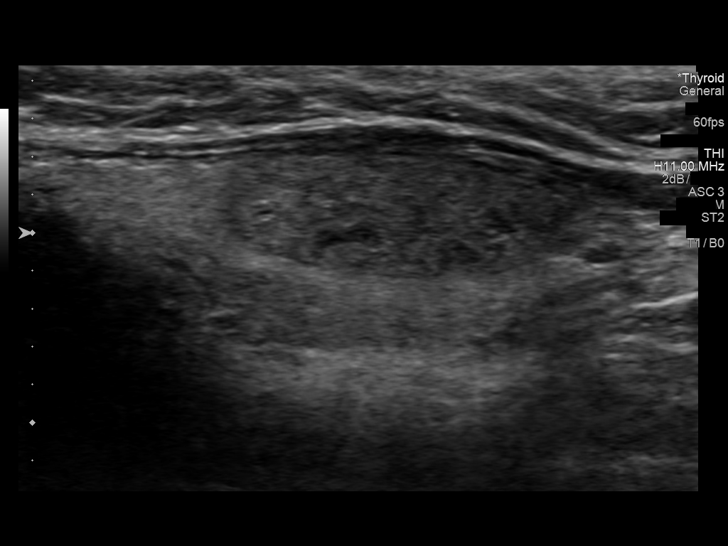
[im 40/54]
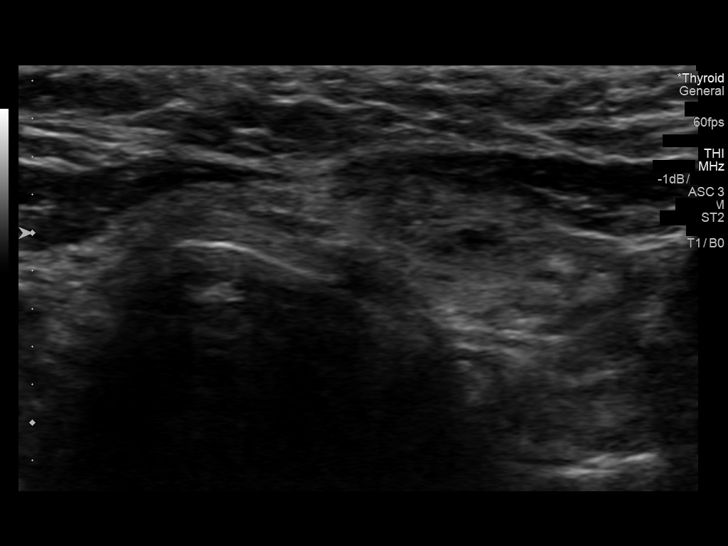
[im 45/54]
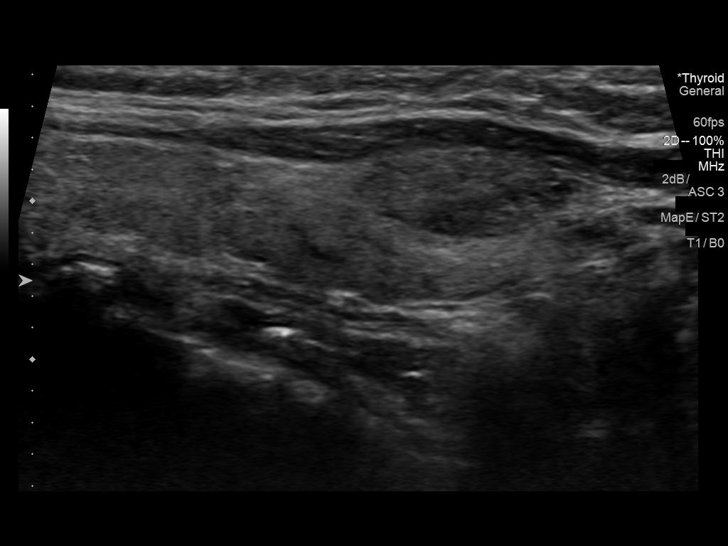
[im 49/54]
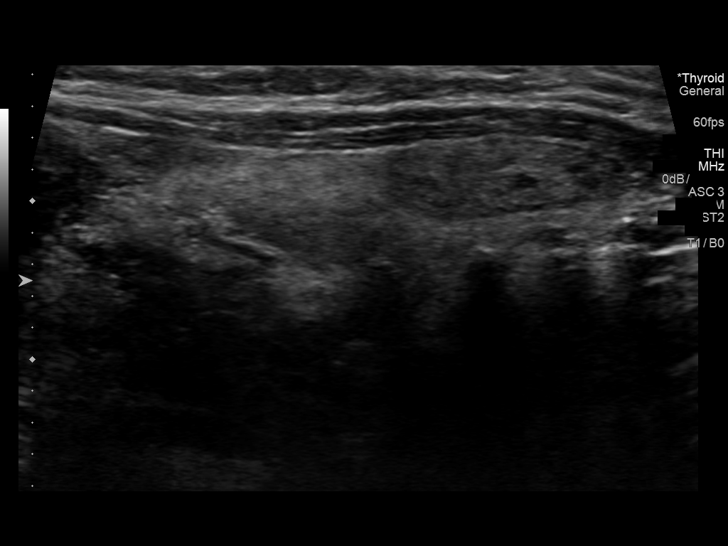
[im 54/54]
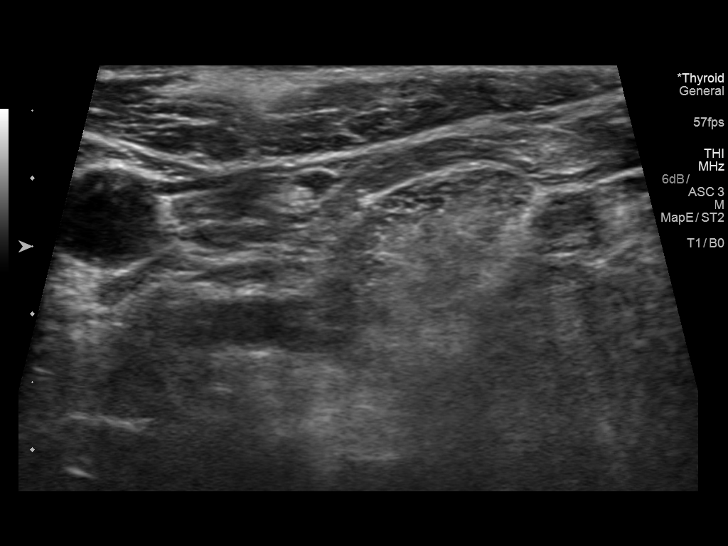

[13 of 25 positions shown; findings below may reference images not displayed]

FINDINGS: Parenchymal Echotexture: Mildly heterogenous

Isthmus: Normal in size measuring 0.2 cm in diameter, unchanged

Right lobe: Slightly diminutive in size measuring 3.9 x 0.8 x
cm, unchanged, previously, 4.3 x 1.2 x 1.3 cm

Left lobe: Slightly diminutive in size measuring 4.0 x 1.0 x 1.5 cm,
unchanged, previously, 4.4 x 1.5 x 1.3 cm

_________________________________________________________

Estimated total number of nodules >/= 1 cm: 1

Number of spongiform nodules >/=  2 cm not described below (TR1): 0

Number of mixed cystic and solid nodules >/= 1.5 cm not described
below (TR2): 0

_________________________________________________________

The previously biopsied approximately 1.9 x 1.4 x 0.7 cm nodule
within the inferior pole the left lobe of the thyroid appears
morphologically similar to the [DATE] examination, previously,
x 1.5 x 0.7 cm. Correlation with prior biopsy results is recommended
however stability for a 5 years is also suggestive of a benign
etiology.

Note is made of an approximately 0.6 cm nodule/pseudo nodule within
the mid aspect the left lobe of the thyroid appears similar to the
[DATE] examination and again does not meet imaging criteria to
recommend percutaneous sampling or dedicated follow-up.

Note is made of an approximately 0.7 cm anechoic cystic lesion
within the inferior pole the right lobe of the thyroid which does
not meet imaging criteria to recommend percutaneous sampling or
dedicated follow-up.
IMPRESSION: 1. Similar findings of multinodular goiter.
2. The previously biopsied approximately 1.9 cm nodule within the
left lobe of the thyroid appears similar to the [DATE] examination.
Correlation with prior biopsy results is recommended, however
stability for 5 years is also suggestive of a benign etiology.
Assuming a benign pathologic diagnosis, neither this nor any of the
other nodules meet imaging criteria to recommend percutaneous
sampling or continued dedicated follow-up.
The above is in keeping with the ACR TI-RADS recommendations - [HOSPITAL] 5360;[DATE].

## 2021-10-07 ENCOUNTER — Telehealth: Payer: Self-pay

## 2021-10-07 NOTE — Telephone Encounter (Signed)
Spoke with patients husband who stated that patient has advanced dementia and patient will not leave the house, asked patients husband if he could send a manual transmission I could walk him through on how to send a transmission he stated that she was asleep and he didn't want to wake her up. Informed patients husband that I would mail instruction on how to send a manual transmission with direct number to device clinic.

## 2022-01-26 ENCOUNTER — Ambulatory Visit (INDEPENDENT_AMBULATORY_CARE_PROVIDER_SITE_OTHER): Payer: Medicare Other | Admitting: Physician Assistant

## 2022-01-26 VITALS — BP 168/72 | HR 89 | Temp 98.3°F | Ht 63.0 in | Wt 130.0 lb

## 2022-01-26 DIAGNOSIS — F05 Delirium due to known physiological condition: Secondary | ICD-10-CM | POA: Diagnosis not present

## 2022-01-26 DIAGNOSIS — Z95810 Presence of automatic (implantable) cardiac defibrillator: Secondary | ICD-10-CM | POA: Diagnosis not present

## 2022-01-26 DIAGNOSIS — E038 Other specified hypothyroidism: Secondary | ICD-10-CM | POA: Diagnosis not present

## 2022-01-26 LAB — T4, FREE: Free T4: 1.25 ng/dL (ref 0.60–1.60)

## 2022-01-26 LAB — CBC WITH DIFFERENTIAL/PLATELET
Basophils Absolute: 0.1 10*3/uL (ref 0.0–0.1)
Basophils Relative: 1.1 % (ref 0.0–3.0)
Eosinophils Absolute: 0 10*3/uL (ref 0.0–0.7)
Eosinophils Relative: 0.4 % (ref 0.0–5.0)
HCT: 45.5 % (ref 36.0–46.0)
Hemoglobin: 15.3 g/dL — ABNORMAL HIGH (ref 12.0–15.0)
Lymphocytes Relative: 18.8 % (ref 12.0–46.0)
Lymphs Abs: 1.2 10*3/uL (ref 0.7–4.0)
MCHC: 33.5 g/dL (ref 30.0–36.0)
MCV: 90.6 fl (ref 78.0–100.0)
Monocytes Absolute: 0.4 10*3/uL (ref 0.1–1.0)
Monocytes Relative: 6.4 % (ref 3.0–12.0)
Neutro Abs: 4.6 10*3/uL (ref 1.4–7.7)
Neutrophils Relative %: 73.3 % (ref 43.0–77.0)
Platelets: 265 10*3/uL (ref 150.0–400.0)
RBC: 5.02 Mil/uL (ref 3.87–5.11)
RDW: 13.5 % (ref 11.5–15.5)
WBC: 6.2 10*3/uL (ref 4.0–10.5)

## 2022-01-26 LAB — VITAMIN B12: Vitamin B-12: 237 pg/mL (ref 211–911)

## 2022-01-26 LAB — COMPREHENSIVE METABOLIC PANEL
ALT: 8 U/L (ref 0–35)
AST: 11 U/L (ref 0–37)
Albumin: 4.5 g/dL (ref 3.5–5.2)
Alkaline Phosphatase: 43 U/L (ref 39–117)
BUN: 15 mg/dL (ref 6–23)
CO2: 28 mEq/L (ref 19–32)
Calcium: 9.7 mg/dL (ref 8.4–10.5)
Chloride: 101 mEq/L (ref 96–112)
Creatinine, Ser: 0.88 mg/dL (ref 0.40–1.20)
GFR: 62.34 mL/min (ref >60.00)
Glucose, Bld: 97 mg/dL (ref 70–99)
Potassium: 4.1 mEq/L (ref 3.5–5.1)
Sodium: 139 mEq/L (ref 135–145)
Total Bilirubin: 0.8 mg/dL (ref 0.2–1.2)
Total Protein: 7 g/dL (ref 6.0–8.3)

## 2022-01-26 LAB — TSH: TSH: 1.77 u[IU]/mL (ref 0.35–5.50)

## 2022-01-26 NOTE — Patient Instructions (Signed)
Good to meet you today! ?Please go to the lab for blood work and I will send results through [place]. ? ?Referral to cardiology. ? ?Medication to help with sleep pending labs.  ?

## 2022-01-26 NOTE — Progress Notes (Signed)
? ?Subjective:  ? ? Patient ID: [name], female    DOB: 1940/12/23, 81 y.o.   MRN: [id number] ? ?Chief Complaint  ?Patient presents with  ? Dementia  ? ? ?HPI ?81 y.o. patient presents today for new patient establishment with me.  Patient was previously established with Dr. Deland Pretty. Here with husband, [name]. Lives at home together in [place].  ? ?Haven't seen any providers since 2019 - husband says pt declined to go. ? ?Husband says they've never been to [facility] he thinks that medications listed in our report are for a different patient named Kieli Golladay who lives in their town. ? ?Takes no prescribed medications. Ibuprofen occasionally. She doesn't get sick often.  ? ?Current Care Team: ?Previously saw Dr. Jaynee Eagles  ? ?Acute Concerns: ?Sundowner's ?-Alarm system to tell him when she's awake ?-Up 2-3 times per night, moves items around, doesn't get aggressive ?-Falls asleep right away but then up in a few hours ?-No hx insomnia  ? ?Presence of cardiac defibrillator - haven't seen cardiology in a long time. Husband unsure of last cardiologist.  ? ?Doesn't drive. ?Smokes about 2 cigs / week.  ?Ambulates independently, stumbles occasionally per husband. ?Little to no appetite, usually eats only about once per day and he has to sit her down to do this meal.  ? ?Husband says he can manage her care he just needs her to sleep better at night. Daughter lives next door and helps as well.  ? ?Past Medical History:  ?Diagnosis Date  ? Arthritis   ? Arthritis   ? Cardiac defibrillator in place 2005  ? Congestive heart failure ([place])   ? Depression   ? Fibromyalgia   ? Gallbladder disease   ? Osteoporosis   ? Osteoporosis   ? Thyroid disease   ? Thyroid disease   ? ? ?Past Surgical History:  ?Procedure Laterality Date  ? CARDIAC CATHETERIZATION  2005  ? CARDIAC DEFIBRILLATOR PLACEMENT  12-23-2003  ? MDT single chamber ICD implanted by Dr Caryl Comes [phone] lead)  ? COLONOSCOPY    ? COLONOSCOPY  2010  ? Adenomatous  with high grade dysplasia  ? HIP ARTHROPLASTY Left 11/12/2014  ? Procedure: Left Hip Hemiarthroplasty;  Surgeon: Augustin Schooling, MD;  Location: [facility];  Service: Orthopedics;  Laterality: Left;  ? IMPLANTABLE CARDIOVERTER DEFIBRILLATOR GENERATOR CHANGE  01-18-2013  ? MDT ICD gen change with insertion of new RV lead, capping of [id number] lead by Dr Lovena Le  ? IMPLANTABLE CARDIOVERTER DEFIBRILLATOR GENERATOR CHANGE N/A 01/18/2013  ? Procedure: IMPLANTABLE CARDIOVERTER DEFIBRILLATOR GENERATOR CHANGE;  Surgeon: Evans Lance, MD;  Location: [facility] CATH LAB;  Service: Cardiovascular;  Laterality: N/A;  ? LEAD REVISION N/A 01/18/2013  ? Procedure: LEAD REVISION;  Surgeon: Evans Lance, MD;  Location: [facility] CATH LAB;  Service: Cardiovascular;  Laterality: N/A;  ? PACEMAKER INSERTION    ? NOT MRI SAFE  ? POLYPECTOMY    ? ? ?Family History  ?Problem Relation Age of Onset  ? Breast cancer Sister   ? Seizures Neg Hx   ? Dementia Neg Hx   ? ? ?Social History  ? ?Tobacco Use  ? Smoking status: Every Day  ?  Packs/day: 0.20  ?  Types: Cigarettes  ? Smokeless tobacco: Never  ?Vaping Use  ? Vaping Use: Never used  ?Substance Use Topics  ? Alcohol use: No  ?  Alcohol/week: 0.0 standard drinks  ? Drug use: No  ?  ? ?Allergies  ?Allergen Reactions  ?  Sulfonamide Derivatives Other (See Comments)  ?  Blisters in mouth  ? ? ?Review of Systems ?NEGATIVE UNLESS OTHERWISE INDICATED IN HPI ? ? ?   ?Objective:  ?  ? ?BP (!) 168/72   Pulse 89   Temp 98.3 ?F (36.8 ?C)   Ht '5\' 3"'$  (1.6 m)   Wt 130 lb (59 kg)   SpO2 98%   BMI 23.03 kg/m?  ? ?Wt Readings from Last 3 Encounters:  ?01/26/22 130 lb (59 kg)  ?02/12/18 148 lb (67.1 kg)  ?01/24/17 149 lb 3.2 oz (67.7 kg)  ? ? ?BP Readings from Last 3 Encounters:  ?01/26/22 (!) 168/72  ?02/12/18 123/68  ?01/24/17 118/64  ?  ? ?Physical Exam ?Vitals and nursing note reviewed.  ?Constitutional:   ?   Appearance: Normal appearance. She is normal weight. She is not toxic-appearing.  ?HENT:  ?   Head: Normocephalic  and atraumatic.  ?   Right Ear: External ear normal.  ?   Left Ear: External ear normal.  ?   Nose: Nose normal.  ?   Mouth/Throat:  ?   Mouth: Mucous membranes are moist.  ?Eyes:  ?   Extraocular Movements: Extraocular movements intact.  ?   Conjunctiva/sclera: Conjunctivae normal.  ?   Pupils: Pupils are equal, round, and reactive to light.  ?Cardiovascular:  ?   Rate and Rhythm: Normal rate and regular rhythm.  ?   Pulses: Normal pulses.  ?   Heart sounds: Normal heart sounds.  ?Pulmonary:  ?   Effort: Pulmonary effort is normal.  ?   Breath sounds: Normal breath sounds.  ?Musculoskeletal:     ?   General: Normal range of motion.  ?   Cervical back: Normal range of motion and neck supple.  ?   Right lower leg: No edema.  ?   Left lower leg: No edema.  ?Skin: ?   General: Skin is warm and dry.  ?Neurological:  ?   General: No focal deficit present.  ?   Mental Status: She is alert and oriented to person, place, and time.  ?Psychiatric:     ?   Mood and Affect: Mood normal.     ?   Behavior: Behavior normal.  ? ? ?   ?Assessment & Plan:  ? ?Problem List Items Addressed This Visit   ? ?  ? Endocrine  ? Hypothyroidism  ? Relevant Orders  ? TSH (Completed)  ? T4, free (Completed)  ?  ? Other  ? Automatic implantable cardioverter-defibrillator in situ  ? Relevant Orders  ? Ambulatory referral to Cardiology  ? ?Other Visit Diagnoses   ? ? Sundowning    -  Primary  ? Relevant Orders  ? CBC with Differential/Platelet (Completed)  ? Comprehensive metabolic panel (Completed)  ? Vitamin B12 (Completed)  ? ?  ? ? ?1. Sundowning ?Reported per husband. Non-aggressive behavior, but not sleeping through the night. He does not have any help at home in the evenings. Requesting medication to help her sleep. Will check labs and treat pending results - will consider Remeron which would increase her appetite as well. ? ?2. Automatic implantable cardioverter-defibrillator in situ ?No problems reported, but needing battery checked. Refer  to cardiology to monitor. ? ?3. Other specified hypothyroidism ?Husband reports not taking any medication. Looks like she was last on levothyroxine 112 mcg in 2019. Check labs today and treat accordingly. ? ?F/up 3 months or prn  ? ? ?Celestine Bougie M Ralf Konopka, PA-C ?

## 2022-01-31 ENCOUNTER — Other Ambulatory Visit: Payer: Self-pay | Admitting: Physician Assistant

## 2022-01-31 MED ORDER — MIRTAZAPINE 7.5 MG PO TABS: 7.5000 mg | ORAL_TABLET | Freq: Every day | ORAL | 0 refills | Status: DC

## 2022-02-01 ENCOUNTER — Telehealth: Payer: Self-pay | Admitting: Physician Assistant

## 2022-02-01 NOTE — Telephone Encounter (Signed)
Pt's husband stated his spouse is getting worse and is wanting to know the results of a Dementia test that he states was done in office on 01/26/22. He states he will only be available on 4/12 around 12pm if someone can give him a call. Please advise ?

## 2022-02-02 NOTE — Telephone Encounter (Signed)
Spoke with husband and went over lab results and also mailed a copy of labs as requested. ?

## 2022-02-07 ENCOUNTER — Telehealth: Payer: Self-pay | Admitting: Physician Assistant

## 2022-02-07 NOTE — Telephone Encounter (Signed)
Pt's spouse states pt is getting worse and she is trying to leave. He believes the new medication is not helping and may be making her worse. He is asking for help. Please advise ?

## 2022-02-07 NOTE — Telephone Encounter (Signed)
Pt's spouse states he does not feel comfortable to do a VV and stated he does not know how. He asked to come in asap. The only slot available was 11:45 am. He said he only needs a few moments to talk with her provider. If this does not work, please let me know and I will reschedule. ?

## 2022-02-07 NOTE — Telephone Encounter (Signed)
Schedule app with pt. ? ?Thanks! ?

## 2022-02-08 ENCOUNTER — Encounter: Payer: Medicare Other | Admitting: Physician Assistant

## 2022-02-09 ENCOUNTER — Ambulatory Visit: Payer: Medicare Other | Admitting: Physician Assistant

## 2022-02-09 ENCOUNTER — Telehealth: Payer: Medicare Other | Admitting: Physician Assistant

## 2022-02-09 NOTE — Progress Notes (Signed)
APPOINTMENT RESCHEDULED AS PATIENT WAS NOT PRESENT WITH HER HUSBAND TODAY. HE PREFERS A TELEPHONE VISIT TOMORROW AS IT IS HARD TO GET HER OUT OF THE HOUSE.  ?

## 2022-02-10 ENCOUNTER — Encounter: Payer: Self-pay | Admitting: Physician Assistant

## 2022-02-10 ENCOUNTER — Ambulatory Visit: Payer: Medicare Other | Admitting: Physician Assistant

## 2022-02-10 ENCOUNTER — Telehealth: Payer: Self-pay

## 2022-02-10 VITALS — Ht 63.0 in | Wt 130.1 lb

## 2022-02-10 DIAGNOSIS — G309 Alzheimer's disease, unspecified: Secondary | ICD-10-CM

## 2022-02-10 DIAGNOSIS — F05 Delirium due to known physiological condition: Secondary | ICD-10-CM | POA: Diagnosis not present

## 2022-02-10 DIAGNOSIS — F0280 Dementia in other diseases classified elsewhere without behavioral disturbance: Secondary | ICD-10-CM | POA: Insufficient documentation

## 2022-02-10 MED ORDER — RAMELTEON 8 MG PO TABS: 8.0000 mg | ORAL_TABLET | Freq: Every day | ORAL | 0 refills | Status: DC

## 2022-02-10 NOTE — Progress Notes (Signed)
Virtual Visit via Telephone Note ? ?I connected with [name] on 02/10/22 at 11:30 AM EDT by telephone and verified that I am speaking with the correct person using two identifiers. ? ?The patient's husband, [name], performed telephone visit with me today acting on behalf of his wife the patient, [name].  She has a history of Alzheimer's dementia and is unable to provide accurate history. ? ?Location: ?Patient: home ?Provider: Therapist, music at [employer] ? ? ?I discussed the limitations, risks, security and privacy concerns of performing an evaluation and management service by telephone and the availability of in person appointments. I also discussed with the patient that there may be a patient responsible charge related to this service. The patient expressed understanding and agreed to proceed. ? ? ?History of Present Illness: ? ?Patient's husband tells me that she is having more issues in the evening times.  He thinks that she is very nerved up.  ? ?Patient was on Xanax for her nerves years ago and husband thinks she needs to go back on this. Hasn't been on this for about 20 years.  ?Gave mirtazapine about three times - tried to leave home, didn't recognize who he was, started acting aggressive.  ?[address] out [employer] into [employer].  Denies any injury. ?Pt slept about 15 minutes last night. ? ?Hip replacement a few years ago - claims this bothers her if she walks too much.  ? ?Does have night lights in the house. Pt states she is not incontinent. Does not complain of pain at bedtime.  ? ?Daughter lives next door and watches her til around 1 pm if [name] has to go out. He tries not to ask for much help. ? ?"If I can just get her calmed down, I can take care of her."  ? ?  ?Observations/Objective: ? ?Speaking with patient's husband, [name], he sounds very concerned about her wellbeing. ? ?Assessment and Plan: ? ?1. Sundowning ?2. Alzheimer disease ([place]) ?Discussed with [name] today that there  is a natural progression and Alzheimer's dementia that gradually worsens over time.  It sounds like mirtazapine was not helpful for her.  I informed him of the risks with benzodiazepines and stated this was not a good idea for her to be taking and I would not prescribe this for her.  We can certainly try Rozerem 8 mg at bedtime.  Discussed behavioral techniques to help such as restricting caffeine and daytime naps.  Trying to exercise early in the day.  Using night lights at night.  ? ?I have also placed a referral to chronic care management to reach out with available resources to help manage her disease.  I advised that he could follow-up with Dr. Jaynee Eagles as well, but [name] did not want to pursue this route at this time. ? ? ?Follow Up Instructions: ? ?Patient's husband was advised to call back in the next few weeks to let me know how she is doing. ? ? ? ?I provided 11 minutes & 10 seconds of non-face-to-face time during this encounter. ? ? ?[place] Riesen M Mashayla Lavin, PA-C ? ?

## 2022-02-10 NOTE — Chronic Care Management (AMB) (Signed)
?  Care Management  ? ?Note ? ?02/10/2022 ?Name: [name] MRN: [id number] DOB: 08-18-1941 ? ?[name] is a 81 y.o. year old female who is a primary care patient of Allwardt, Alyssa M, PA-C. I reached out to [name] by phone today offer care coordination services.  ? ?Ms. Thien was given information about care management services today including:  ?Care management services include personalized support from designated clinical staff supervised by her physician, including individualized plan of care and coordination with other care providers ?24/7 contact phone numbers for assistance for urgent and routine care needs. ?The patient may stop care management services at any time by phone call to the office staff. ? ?Patient agreed to services and verbal consent obtained.  ? ?Follow up plan: ?Telephone appointment with care management team member scheduled for:02/17/2022 ? ?Noreene Larsson, RMA ?Care Guide, Embedded Care Coordination ?[facility]  ?[place], [place] [address] ?Direct Dial: [phone] ?Museum/gallery [email or web address] ?Website: [email or web address]  ? ?

## 2022-02-17 ENCOUNTER — Ambulatory Visit: Payer: Medicare Other | Admitting: Licensed Clinical Social Worker

## 2022-02-17 ENCOUNTER — Encounter: Payer: Self-pay | Admitting: Licensed Clinical Social Worker

## 2022-02-17 DIAGNOSIS — Z7189 Other specified counseling: Secondary | ICD-10-CM

## 2022-02-17 DIAGNOSIS — F05 Delirium due to known physiological condition: Secondary | ICD-10-CM

## 2022-02-17 DIAGNOSIS — F0280 Dementia in other diseases classified elsewhere without behavioral disturbance: Secondary | ICD-10-CM

## 2022-02-17 NOTE — Patient Instructions (Signed)
Visit Information ? ?Thank you for taking time to visit with me today. Please don't hesitate to contact me if I can be of assistance to you before our next scheduled telephone appointment. ? ?Following are the goals we discussed today: Education for Caregiver ?Task & Summary: ?Review attached educational information in your after visit summary ?I have also included additional information for managing aggression, agitation and sundowning ? ?No follow up scheduled, per our conversation you do not desire continued follow up ?I will disconnect from your care team at this time, please call the office if additional needs are identified  ? ?Casimer Lanius, LCSW ?Licensed Clinical Social Worker [facility]  ?[facility] ? ?Please call the care guide team at [phone] if you need to cancel or reschedule your appointment.  ? ?The patient's husband verbalized understanding of instructions, educational materials, and care plan provided today and agreed to receive a mailed copy of patient instructions, educational materials, and care plan.  ? ? ? ?  ?

## 2022-02-17 NOTE — Chronic Care Management (AMB) (Signed)
? ?  Social Work  ?Care Coordination Note ? ?02/17/2022 ?Name: [name] MRN: [id number] DOB: 03-26-1941 ? ?Subjective: ?[name] is a 81 y.o. year old female who is a primary care patient of Allwardt, Alyssa M, PA-C. The Care Management team was consulted to assist the patient with:  Dementia and Caregiver Support.   ? ?Engaged with Caregiver  by phone for initial visit in response to provider referral for social work case management and/or care coordination services.  ? ?Summary: Assessed patient's current treatment, progress, coping skills, support system and barriers to care.  ?Patient's husband [name] provided all information during this encounter. He declined home and community resources for caregiver support. ?Reports doing welling caring for patient, he would like her medication increased to decrease her level of agitation. Per Note 02/10/22 from provider husband was advised to follow-up with Dr. Jaynee Eagles, husband reports not knowing who this is and what he needs to do. ?CCM LCSW collaborated with PA-C Alyssa Allwardt  to assist with meeting patient's needs.  .  ? ?Recommendation: Patient may benefit from, and is in agreement to review educational information mailed.  ? ?Follow up Plan: ?  Husband does not desire continued follow-up by CCM LCSW. Will contact the office if needed ?CCM LCSW will disconnect from patient's care team at this time, but will be available at any time they would like to re-engage for care coordination services.  ?  ?SDOH (Social Determinants of Health) performed : No needs identified ? ?Needs addressed/monitored:  ?Dementia Care:  (Status: New goal. Patient's husband declined further engagement on this goal.) ?Current level of care: home with other family or significant other(s): spouse ?Evaluation of patient safety in current living environment ?Quality of sleep assessed & Sleep Hygiene techniques promoted  ?Participation in support group encouraged  ?Discussed caregiver  resources and support: open to receive resources ?Reviewed behavioral techniques with husband shared by PA-C  ? ?Task & Summary for AVS: ?Review attached educational information in your after visit summary ?I have also included additional information for managing aggression, agitation and sundowning ? ?Review of patient past medical history, allergies, medications, and health status, including review of pertinent consultant reports was performed as part of comprehensive evaluation and provision of care management/care coordination services.  ?  ? Casimer Lanius, LCSW ?Licensed Clinical Social Worker [facility]  ?[place] ?[phone]  ?

## 2022-03-03 ENCOUNTER — Encounter: Payer: Self-pay | Admitting: Physician Assistant

## 2022-03-03 ENCOUNTER — Ambulatory Visit (INDEPENDENT_AMBULATORY_CARE_PROVIDER_SITE_OTHER): Payer: Medicare Other | Admitting: Physician Assistant

## 2022-03-03 VITALS — Ht 63.0 in | Wt 130.1 lb

## 2022-03-03 DIAGNOSIS — G4720 Circadian rhythm sleep disorder, unspecified type: Secondary | ICD-10-CM | POA: Diagnosis not present

## 2022-03-03 DIAGNOSIS — G309 Alzheimer's disease, unspecified: Secondary | ICD-10-CM

## 2022-03-03 DIAGNOSIS — F0280 Dementia in other diseases classified elsewhere without behavioral disturbance: Secondary | ICD-10-CM | POA: Diagnosis not present

## 2022-03-03 DIAGNOSIS — F05 Delirium due to known physiological condition: Secondary | ICD-10-CM

## 2022-03-03 NOTE — Progress Notes (Signed)
Virtual Visit via Telephone Note ? ?I connected with [name] on 02/10/22 at 11:30 AM EDT by telephone and verified that I am speaking with the correct person using two identifiers. ? ?The patient's husband, [name], performed telephone visit with me today acting on behalf of his wife the patient, [name].  She has a history of Alzheimer's dementia and is unable to provide accurate history. ? ?Location: ?Patient: home ?Provider: Therapist, music at [employer] ? ?I discussed the limitations, risks, security and privacy concerns of performing an evaluation and management service by telephone and the availability of in person appointments. I also discussed with the patient that there may be a patient responsible charge related to this service. The patient expressed understanding and agreed to proceed. ? ? ?History of Present Illness: ? ?Spoke with patient's husband, [name], today, who thinks she "needs something stronger to sleep at night."  ?- Pt waking up at 2-3 am, sometimes staying up til 6 am; usually happening about 3 times per week. 2 nights in the last few weeks she didn't sleep much at all. On those nights even with Rozerem 8 mg didn't make a difference. ?-If she is getting agitated before bed, usually doesn't sleep well. Moving stuff from house into the car.  He says keys, gun, knives are locked away. States she's having trouble swallowing pills recently - husband says like she forgets how to swallow it. No other swallowing issues with food or drinks. Doesn't eat much here lately.  ? ? ?Prior HPI from 02/11/31: ?"Patient's husband tells me that she is having more issues in the evening times.  He thinks that she is very nerved up.  ? ?Patient was on Xanax for her nerves years ago and husband thinks she needs to go back on this. Hasn't been on this for about 20 years.  ?Gave mirtazapine about three times - tried to leave home, didn't recognize who he was, started acting aggressive.  ?[address] out  [employer] into [employer].  Denies any injury. ?Pt slept about 15 minutes last night. ? ?Hip replacement a few years ago - claims this bothers her if she walks too much.  ? ?Does have night lights in the house. Pt states she is not incontinent. Does not complain of pain at bedtime.  ? ?Daughter lives next door and watches her til around 1 pm if [name] has to go out. He tries not to ask for much help. ? ?"If I can just get her calmed down, I can take care of her."  ? ?  ?Observations/Objective: ? ?Speaking with patient's husband, [name], he sounds very concerned about her wellbeing. ? ?Assessment and Plan: ? ?1. Sundowning ?2. Alzheimer disease ([place]) ?Discussed with [name] today again that there is a natural progression and Alzheimer's dementia that gradually worsens over time.  ?-Rozerem 8 mg helps her fall asleep. Pt forgetting how to swallow tablets. Will switch to liquid melatonin 10 mg OTC after speaking with pharmacist.  ?-Discussed behavioral techniques to help such as restricting caffeine and daytime naps.  Trying to exercise early in the day.  Using night lights at night.  ? ?Informed pt of options from pharmacist:  ?Melatonin liquid 2.5 mg/10 mL available today.  ? ?10 mg fast-dissolve tablets - should be in around lunch time tomorrow.   ? ? ?Follow Up Instructions: ? ?Patient's husband was advised to call back in the next few weeks to let me know how she is doing. ? ? ?I provided  10 minutes & 25 seconds of non-face-to-face time during this encounter. ? ?I spent an addition 7 minutes on the phone with pharmacist to discuss changing medication from tablet to liquid or dissolvable. ? ?Issachar Broady M Isidore Margraf, PA-C ?

## 2022-03-10 ENCOUNTER — Other Ambulatory Visit: Payer: Self-pay

## 2022-03-10 ENCOUNTER — Telehealth: Payer: Self-pay | Admitting: Physician Assistant

## 2022-03-10 MED ORDER — RAMELTEON 8 MG PO TABS: 8.0000 mg | ORAL_TABLET | Freq: Every day | ORAL | 0 refills | Status: DC

## 2022-03-10 NOTE — Telephone Encounter (Signed)
Pt's spouse states he would like a call if this is not going to be filled bc only one pill left.   Encourage patient to contact the pharmacy for refills or they can request refills through [facility]:   03/03/22  NEXT APPOINTMENT DATE: N/a  MEDICATION: ramelteon (ROZEREM) 8 MG tablet [[id number]]   Is the patient out of medication?  Has one left, as of 05/18  PHARMACY: [place], [place] - [address], [place] [address]  Phone:  [phone]  Fax:  [phone]    Let patient know to contact pharmacy at the end of the day to make sure medication is ready.  Please notify patient to allow 48-72 hours to process

## 2022-03-10 NOTE — Telephone Encounter (Signed)
Sent to pharmacy 

## 2022-03-11 ENCOUNTER — Encounter: Payer: Medicare Other | Admitting: Internal Medicine

## 2022-04-12 ENCOUNTER — Telehealth: Payer: Self-pay | Admitting: Physician Assistant

## 2022-04-12 NOTE — Telephone Encounter (Signed)
Patient's husband [name] requests to be called to ph# [phone] to discuss Patient's medication. [name] states he has concerns.

## 2022-04-12 NOTE — Telephone Encounter (Signed)
Called and spoke with patient husband [name]; advised [name]'s recommendations and he still is not wanting the referral to Neurology and advised he would try another physician's office.

## 2022-04-12 NOTE — Telephone Encounter (Signed)
pt is having issues with sleeping at night and per husband needs max dose. getting up at 1-2am and staying up for a couple hours then going back to bed. [name] states OTC meds for during the day works at times but not alot and she is getting aggravated in the afternoons due to [place]. Pt taking sleep the meds to help sleep at 8:30-9pm isn't working at all. This is third medication and none have worked thus far. [name] requesting max dose for patient please advise

## 2022-05-04 DIAGNOSIS — I4901 Ventricular fibrillation: Secondary | ICD-10-CM | POA: Diagnosis not present

## 2022-05-04 DIAGNOSIS — D696 Thrombocytopenia, unspecified: Secondary | ICD-10-CM | POA: Diagnosis not present

## 2022-05-04 DIAGNOSIS — I5032 Chronic diastolic (congestive) heart failure: Secondary | ICD-10-CM | POA: Diagnosis not present

## 2022-05-04 DIAGNOSIS — G4720 Circadian rhythm sleep disorder, unspecified type: Secondary | ICD-10-CM | POA: Diagnosis not present

## 2022-05-04 DIAGNOSIS — G309 Alzheimer's disease, unspecified: Secondary | ICD-10-CM | POA: Diagnosis not present

## 2022-07-18 ENCOUNTER — Encounter: Payer: Self-pay | Admitting: *Deleted

## 2022-07-22 DIAGNOSIS — G309 Alzheimer's disease, unspecified: Secondary | ICD-10-CM | POA: Diagnosis not present

## 2022-07-22 DIAGNOSIS — F32A Depression, unspecified: Secondary | ICD-10-CM | POA: Diagnosis not present

## 2022-10-06 ENCOUNTER — Encounter: Payer: Self-pay | Admitting: *Deleted

## 2022-11-04 DIAGNOSIS — G4720 Circadian rhythm sleep disorder, unspecified type: Secondary | ICD-10-CM | POA: Diagnosis not present

## 2022-11-04 DIAGNOSIS — E039 Hypothyroidism, unspecified: Secondary | ICD-10-CM | POA: Diagnosis not present

## 2022-11-04 DIAGNOSIS — Z23 Encounter for immunization: Secondary | ICD-10-CM | POA: Diagnosis not present

## 2022-11-04 DIAGNOSIS — D696 Thrombocytopenia, unspecified: Secondary | ICD-10-CM | POA: Diagnosis not present

## 2022-11-04 DIAGNOSIS — G309 Alzheimer's disease, unspecified: Secondary | ICD-10-CM | POA: Diagnosis not present

## 2022-11-04 DIAGNOSIS — I5032 Chronic diastolic (congestive) heart failure: Secondary | ICD-10-CM | POA: Diagnosis not present

## 2022-11-04 DIAGNOSIS — I4901 Ventricular fibrillation: Secondary | ICD-10-CM | POA: Diagnosis not present

## 2023-01-10 ENCOUNTER — Telehealth: Payer: Self-pay | Admitting: *Deleted

## 2023-01-10 NOTE — Patient Outreach (Signed)
  Care Coordination   Initial Visit Note   01/10/2023 Name: [name] MRN: [id number] DOB: 03-08-1941  [name] is a 82 y.o. year old female who sees Allwardt, Randa Evens, PA-C for primary care. I  spoke with pt's spouse who reports pt is no longer going to [facility]- now is with [facility] for PCP.   What matters to the patients health and wellness today?  Care Coordination services declined.   Goals Addressed   None     SDOH assessments and interventions completed:  No     Care Coordination Interventions:  No, not indicated   Follow up plan: No further intervention required.   Encounter Outcome:  Pt. Refused

## 2023-05-26 DIAGNOSIS — R8290 Unspecified abnormal findings in urine: Secondary | ICD-10-CM | POA: Diagnosis not present

## 2023-05-26 DIAGNOSIS — G4720 Circadian rhythm sleep disorder, unspecified type: Secondary | ICD-10-CM | POA: Diagnosis not present

## 2023-05-26 DIAGNOSIS — G309 Alzheimer's disease, unspecified: Secondary | ICD-10-CM | POA: Diagnosis not present

## 2023-09-13 ENCOUNTER — Telehealth: Payer: Self-pay | Admitting: Physician Assistant

## 2023-09-13 NOTE — Telephone Encounter (Signed)
Caller states Patient has been seeing a new PCP in [facility] for over a year.

## 2024-08-22 ENCOUNTER — Ambulatory Visit: Attending: Internal Medicine | Admitting: Internal Medicine

## 2024-08-22 ENCOUNTER — Encounter: Payer: Self-pay | Admitting: Internal Medicine

## 2024-08-22 VITALS — BP 108/66 | HR 71 | Ht 63.0 in | Wt 129.1 lb

## 2024-08-22 DIAGNOSIS — Z01810 Encounter for preprocedural cardiovascular examination: Secondary | ICD-10-CM

## 2024-08-22 NOTE — Patient Instructions (Addendum)
 Medication Instructions:  Your physician recommends that you continue on your current medications as directed. Please refer to the Current Medication list given to you today.  *If you need a refill on your cardiac medications before your next appointment, please call your pharmacy*  Lab Work: None ordered.  You may go to any [facility] Location for your lab work:  [facility] - [address] ([facility]) - [address] [phone] [address]  [place] - [address]   [place]  - [address]   [place] - [address] - [address] [employer]  - [address] - [address] ([facility]   If you have labs (blood work) drawn today and your tests are completely normal, you will receive your results only by: [employer] (if you have MyChart)  If you have any lab test that is abnormal or we need to change your treatment, we will call you or send a MyChart message to review the results.  Testing/Procedures: None ordered.  Follow-Up: At [facility], you and your health needs are our priority.  As part of our continuing mission to provide you with exceptional heart care, we have created designated Provider Care Teams.  These Care Teams include your primary Cardiologist (physician) and Advanced Practice Providers (APPs -  Physician Assistants and Nurse Practitioners) who all work together to provide you with the care you need, when you need it.  Your next appointment:

## 2024-08-22 NOTE — Progress Notes (Signed)
 HPI [name] returns today for followup. She is a pleasant 83 yo woman with an unexplained VF arrest, s/p rescucitation in 2005. In the interim, she denies chest pain, sob, or syncope. No ICD shock.  She has developed a bad tooth and the decision was made due to her dementia, inability to tolerate any pain and prior history of VF arrest to undergo general anesthesia in the hospital for tooth removal.  She denies CHF symptoms and her daughter and husband corroborate this.  Allergies  Allergen Reactions   Sulfonamide Derivatives Other (See Comments)    Blisters in mouth     Current Outpatient Medications  Medication Sig Dispense Refill   QUEtiapine (SEROQUEL) 50 MG tablet Take 50 mg by mouth at bedtime.     No current facility-administered medications for this visit.     Past Medical History:  Diagnosis Date   Arthritis    Arthritis    Cardiac defibrillator in place 2005   Congestive heart failure (HCC)    Depression    Fibromyalgia    Gallbladder disease    Osteoporosis    Osteoporosis    Thyroid  disease    Thyroid  disease     ROS:   All systems reviewed and negative except as noted in the HPI.   Past Surgical History:  Procedure Laterality Date   CARDIAC CATHETERIZATION  2005   CARDIAC DEFIBRILLATOR PLACEMENT  12-23-2003   MDT single chamber ICD implanted by Dr Fernande [phone] lead)   COLONOSCOPY     COLONOSCOPY  2010   Adenomatous with high grade dysplasia   HIP ARTHROPLASTY Left 11/12/2014   Procedure: Left Hip Hemiarthroplasty;  Surgeon: Elspeth JONELLE Her, MD;  Location: [facility] OR;  Service: Orthopedics;  Laterality: Left;   IMPLANTABLE CARDIOVERTER DEFIBRILLATOR GENERATOR CHANGE  01-18-2013   MDT ICD gen change with insertion of new RV lead, capping of [id number] lead by Dr Waddell HAMMER CARDIOVERTER DEFIBRILLATOR GENERATOR CHANGE N/A 01/18/2013   Procedure: IMPLANTABLE CARDIOVERTER DEFIBRILLATOR GENERATOR CHANGE;  Surgeon: Danelle LELON Waddell, MD;  Location: [facility] CATH  LAB;  Service: Cardiovascular;  Laterality: N/A;   LEAD REVISION N/A 01/18/2013   Procedure: LEAD REVISION;  Surgeon: Danelle LELON Waddell, MD;  Location: [facility] CATH LAB;  Service: Cardiovascular;  Laterality: N/A;   PACEMAKER INSERTION     NOT MRI SAFE   POLYPECTOMY       Family History  Problem Relation Age of Onset   Breast cancer Sister    Seizures Neg Hx    Dementia Neg Hx      Social History   Socioeconomic History   Marital status: Married    Spouse name: Ubaldo   Number of children: 2   Years of education: 12   Highest education level: Not on file  Occupational History   Not on file  Tobacco Use   Smoking status: Every Day    Current packs/day: 0.20    Types: Cigarettes   Smokeless tobacco: Never  Vaping Use   Vaping status: Never Used  Substance and Sexual Activity   Alcohol use: No    Alcohol/week: 0.0 standard drinks of alcohol   Drug use: No   Sexual activity: Not on file  Other Topics Concern   Not on file  Social History Narrative   Lives with husband, Ubaldo & grandson also lives with them   Caffeine use: 6 cups per day (coffee)   Right handed   Social Drivers of Health  Financial Resource Strain: Low Risk  (03/14/2024)   Received from [facility]   Overall Financial Resource Strain (CARDIA)    Difficulty of Paying Living Expenses: Not hard at all  Food Insecurity: No Food Insecurity (03/14/2024)   Received from [facility]   Hunger Vital Sign    Within the past 12 months, you worried that your food would run out before you got the money to buy more.: Never true    Within the past 12 months, the food you bought just didn't last and you didn't have money to get more.: Never true  Transportation Needs: No Transportation Needs (03/14/2024)   Received from [facility] - Transportation    Lack of Transportation (Medical): No    Lack of Transportation (Non-Medical): No  Physical Activity: Not on file  Stress: No Stress Concern Present  (02/17/2022)   [employer] - Occupational Stress Questionnaire    Feeling of Stress : Not at all  Social Connections: Unknown (04/07/2022)   Received from [facility]   Social Network    Social Network: Not on file  Intimate Partner Violence: Patient Unable To Answer (02/22/2024)   Received from [facility]   HITS    Over the last 12 months how often did your partner physically hurt you?: Patient unable to answer    Over the last 12 months how often did your partner insult you or talk down to you?: Patient unable to answer    Over the last 12 months how often did your partner threaten you with physical harm?: Patient unable to answer    Over the last 12 months how often did your partner scream or curse at you?: Patient unable to answer     BP 108/66   Pulse 71   Ht 5' 3 (1.6 m)   Wt 129 lb 1.6 oz (58.6 kg)   SpO2 98%   BMI 22.87 kg/m   Physical Exam:  Well appearing 83 yo woman, NAD HEENT: Unremarkable Neck:  No JVD, no thyromegally Lymphatics:  No adenopathy Back:  No CVA tenderness Lungs:  Clear with no wheezes HEART:  Regular rate rhythm, no murmurs, no rubs, no clicks Abd:  soft, positive bowel sounds, no organomegally, no rebound, no guarding Ext:  2 plus pulses, no edema, no cyanosis, no clubbing Skin:  No rashes no nodules Neuro:  CN II through XII intact, motor grossly intact  EKG - NSR with left axis.   DEVICE  Normal device function.  See PaceArt for details.   Assess/Plan:  1. VF arrest - no symptoms or device therapies in over 20 years. 2. ICD - Her medtronic device is no longer functioning. Due to her dementia, no indication for another device placement. 3. Preop eval - the patient is an acceptable candidate for general anesthesia. Ok to proceed with tooth extraction.    Danelle Waddell HERO.D.
